# Patient Record
Sex: Male | Born: 1955 | Race: White | Hispanic: No | Marital: Married | Smoking: Former smoker
Health system: Southern US, Community
[De-identification: ages and names within clinical notes are randomized; demographics above are authoritative.]

## PROBLEM LIST (undated history)

## (undated) DIAGNOSIS — I739 Peripheral vascular disease, unspecified: Secondary | ICD-10-CM

## (undated) DIAGNOSIS — E7800 Pure hypercholesterolemia, unspecified: Secondary | ICD-10-CM

## (undated) DIAGNOSIS — C801 Malignant (primary) neoplasm, unspecified: Secondary | ICD-10-CM

## (undated) HISTORY — PX: BACK SURGERY: SHX140

---

## 2001-08-04 ENCOUNTER — Emergency Department (HOSPITAL_COMMUNITY): Admission: EM | Admit: 2001-08-04 | Discharge: 2001-08-04 | Payer: Self-pay | Admitting: *Deleted

## 2001-08-04 ENCOUNTER — Encounter: Payer: Self-pay | Admitting: *Deleted

## 2001-09-19 ENCOUNTER — Encounter: Payer: Self-pay | Admitting: Emergency Medicine

## 2001-09-19 ENCOUNTER — Emergency Department (HOSPITAL_COMMUNITY): Admission: EM | Admit: 2001-09-19 | Discharge: 2001-09-19 | Payer: Self-pay | Admitting: *Deleted

## 2001-11-29 ENCOUNTER — Encounter: Payer: Self-pay | Admitting: Orthopaedic Surgery

## 2001-11-29 ENCOUNTER — Ambulatory Visit (HOSPITAL_COMMUNITY): Admission: RE | Admit: 2001-11-29 | Discharge: 2001-11-29 | Payer: Self-pay | Admitting: Orthopaedic Surgery

## 2002-01-24 ENCOUNTER — Observation Stay (HOSPITAL_COMMUNITY): Admission: AD | Admit: 2002-01-24 | Discharge: 2002-01-24 | Payer: Self-pay | Admitting: Neurosurgery

## 2006-01-14 ENCOUNTER — Ambulatory Visit (HOSPITAL_COMMUNITY): Admission: RE | Admit: 2006-01-14 | Discharge: 2006-01-14 | Payer: Self-pay | Admitting: Neurosurgery

## 2006-08-08 ENCOUNTER — Ambulatory Visit (HOSPITAL_COMMUNITY): Admission: RE | Admit: 2006-08-08 | Discharge: 2006-08-08 | Payer: Self-pay | Admitting: Orthopedic Surgery

## 2007-01-04 ENCOUNTER — Ambulatory Visit (HOSPITAL_COMMUNITY): Admission: RE | Admit: 2007-01-04 | Discharge: 2007-01-04 | Payer: Self-pay | Admitting: Orthopaedic Surgery

## 2007-03-08 ENCOUNTER — Inpatient Hospital Stay (HOSPITAL_COMMUNITY): Admission: RE | Admit: 2007-03-08 | Discharge: 2007-03-11 | Payer: Self-pay | Admitting: Neurosurgery

## 2011-01-21 NOTE — Op Note (Signed)
NAMEMarland Kitchen  EVERET, FLAGG NO.:  [id number]   MEDICAL RECORD NO.:  [id number]          PATIENT TYPE:  INP   LOCATION:  3006                         FACILITY:  [facility]   PHYSICIAN:  Cristi Loron, M.D.DATE OF BIRTH:  Sep 20, 1955   DATE OF PROCEDURE:  03/08/2007  DATE OF DISCHARGE:                               OPERATIVE REPORT   BRIEF HISTORY:  The patient is a 55 year old white male who has  undergone 2 previous back surgeries for ruptured disk by me.  He did  well but more recently has developed recurrent back and bilateral leg  pain.  He failed medical management and was worked up with a lumbar MRI  which demonstrated he had a recurrent ruptured disk L3-4 and 4-5 with  degenerative disease, spinal stenosis, etc.  I discussed the various  treatment with the patient including surgery.  The patient has weighed  the risks, benefits and alternatives of surgery and decided to proceed  with a L3-4 and L4-5 posterior lumbar interbody fusion, placement of  interbody prosthesis and posterior instrumentation.   PREOPERATIVE DIAGNOSIS:  L3-4 and L4-5 degenerative disk disease,  recurrent herniated stenosis, lumbar stenosis, lumbar radiculopathy,  lumbago.   POSTOPERATIVE DIAGNOSIS:  L3-4 and L4-5 degenerative disk disease,  recurrent herniated stenosis, lumbar stenosis, lumbar radiculopathy,  lumbago.   PROCEDURE:  L3-4 and L4-5 transforaminal lumbar interbody fusion;  insertion of L3-4 and L4-5 interbody prosthesis (Capstone PEEK cages);  L3-4 and L4-5 posterior segmental instrumentation with Legacy titanium  pedicle screws and rods; L3-4 and L4-5 posterolateral arthrodesis with  local morselized autograft bone and Vitoss bone graft extender.  Left L3-  4 redo microdiskectomy, right L4-5 redo microdiskectomy.   SURGEON:  Dr. Delma Officer.   ASSISTANT:  Dr. Venetia Maxon.   ANESTHESIA:  General endotracheal.   ESTIMATED BLOOD LOSS:  250 mL.   SPECIMENS:  None.   DRAINS:   None.   COMPLICATIONS:  None.   DESCRIPTION OF PROCEDURE:  The patient was brought to the operating room  by anesthesia team.  General endotracheal anesthesia was induced.  The  patient was then carefully turned to the prone position on a Wilson  frame.  His lumbosacral region was then prepared with Betadine scrub and  Betadine solution.  Sterile drapes were applied.  I then injected the  area to be incised with Marcaine with epinephrine solution.  I used a  scalpel to incise through the patient's prior surgical incision.  I  extended the incision in a cephalad and caudal direction and then used  the electrocautery to perform a subperiosteal dissection bilaterally  exposing the spinous process of lamina of 2, 3, 4, and 5.  We obtained  intraoperative radiograph to confirm our location.   We then inserted the [facility] retractor for exposure.  We began at L3-  4 on the left.  I used a high-speed drill to extend the patient's prior  left L3 laminotomy in a cephalad direction until I encountered some  relatively nonscarred-down dura.  We then widened the laminotomy with a  Kerrison punch and performed a foraminotomy about the left L3  and L4  nerve roots.  Because of the disk herniation, the decompression at this  level was greater than required to do a simple transforaminal lumbar  interbody fusion, i.e. we performed a decompression of the left L3 and  L4 nerve roots.  We freed up the thecal sac and the L3 and L4 nerve  roots from the epidural tissue, and we encountered expected disk  herniation at the disk space.  We removed multiple fragments using the  pituitary forceps.  We then incised the L3-4 intervertebral disk and  performed a partial intervertebral diskectomy with the pituitary  forceps.   In preparation for the transforaminal lumbar fusion, we used Epstein  Scoville curettes to clear soft tissue from the vertebral endplates at  L3-4.  We removed the inferior facette of L3  to gain a wider lateral  exposure on the left.  We then used a trial spacer to determine to use a  10 x 26-mm Capstone PEEK interbody prosthesis.  We prefilled this  prosthesis with Vitoss and local autograft bone we obtained during the  decompression, as well as filling anterior in the disk space with Vitoss  and local bone graft.  We inserted the prosthesis into the L3-4  interspace and then used a bone tamp and turned it sideways.  We then  filled posterior to the prosthesis with Vitoss and local autograft,  completing the transforaminal lumbar interbody fusion.  I should mention  that prior to placing the prosthesis we retracted neural structures out  of harm's way.   We then repeated this procedure in an analogous fashion at L4-5 on the  right, i.e. we used a high-speed drill to perform a left L4 laminotomy.  We widened lamina with a Kerrison punch.  We removed the right L4-5  ligamentum flavum, performed a foraminotomy about the right L4-L5 nerve  roots, completing it.  Again, we did a wider decompression than was  required to do the transforaminal lumbar interbody fusion.  We incised  the L4-5 intervertebral disk on the right and used Epstein and Scoville  curettes to clear soft tissue from the vertebral endplates of L4-5.  We  then used trial spacers and determined to use a 12 x 26-mm interbody  spacer at this level.  We again prefilled it and placed it into the  intervertebral disk space, turned it sideways.  We filled both anterior  and posterior to the prosthesis with local autograft bone and Vitoss,  completing the transforaminal lumbar interbody fusion at L4-5.  I should  mention we removed the inferior facet of L4.   Having completed the decompression as well as the transforaminal lumbar  interbody fusion and insertion of prosthesis, we now turned our  attention to the instrumentation.  Under fluoroscopic guidance, we  cannulated the bilateral L3, 4, and 5 pedicles with  bone probes.  We  tapped pedicles with 5.5-mm tap.  We probed inside the tapped pedicles  to rule out cortical breeches, and then we inserted a 6.5 x 45-mm  pedicle screws bilaterally at L3, 4, and 5 under fluoroscopic guidance.  We palpated along the medial aspect of the bilateral L3, 4, and 5  pedicles and noted the nerve roots were not injured, and there were no  cortical breeches.  We then connected unilateral pedicle screws with a  lordotic rod which we fastened in place with the capsule which we  tightened appropriately, completing the instrumentation.   We now turned our attention to the posterolateral  arthrodesis.  We  cleared soft tissue from the remainder of the right L3-4 and left L4-5  facette pars region.  We then decorticated the structure with a high-  speed drill and placed a combination of Vitoss and local morselized  autograft bone over these decorticated posterolateral structures  completing the posterolateral arthrodesis.   We then inspected the thecal sac and the left L3 and L4 nerve root and  the right L4 and L5 nerve roots and noted that neural structures were  well decompressed.  We obtained hemostasis using bipolar cautery.  We  then irrigated the wound out with bacitracin solution, removed the  retractor and then reapproximated the patient's thoracolumbar fascia  with interrupted #1 Vicryl suture, subcutaneous tissue with interrupted  2-0 Vicryl suture and skin with Steri-Strips and Benzoin.  The wound was  then coated with bacitracin ointment, a sterile dressing was applied,  the drapes  were removed and the patient was subsequently returned to supine  position where he was extubated by anesthesia team and transported to  the post-anesthesia care unit in stable condition.  All sponge,  instrument and needle counts were correct at the end of this case.      Cristi Loron, M.D.  Electronically Signed     JDJ/MEDQ  D:  03/08/2007  T:  03/09/2007   Job:  [id number]

## 2011-01-24 NOTE — Op Note (Signed)
[place]  Patient:    [name], [name] Visit Number: [id number] MRN: [id number]          Service Type: DFT Location: 3000 [id number] Attending Physician:  Cristi Loron Dictated by:   Cristi Loron, M.D. Proc. Date: 01/24/02 Admit Date:  01/24/2002 Discharge Date: 01/24/2002                             Operative Report  PREOPERATIVE DIAGNOSES:  Left L3-4 herniated nucleus pulposus, spinal stenosis, degenerative disk disease, lumbar radiculopathy, and lumbago.  POSTOPERATIVE DIAGNOSES:  Left L3-4 herniated nucleus pulposus, spinal stenosis, degenerative disk disease, lumbar radiculopathy, and lumbago.  PROCEDURE:  Left L3-4 microdiskectomy using microdissection.  SURGEON:  Cristi Loron, M.D.  ASSISTANT:  Clydene Fake, M.D.  ANESTHESIA:  General endotracheal.  ESTIMATED BLOOD LOSS:  100 cc.  SPECIMENS:  None.  DRAINS:  None.  COMPLICATIONS:  None.  BRIEF HISTORY:  The patient is a 55 year old white male who has suffered from back and left leg pain.  He failed medical management and was worked up with a lumbar MRI, which demonstrated a herniated disk at L3-4 on the left.  His signs, symptoms, and physical exam were consistent with a left L4 radiculopathy.  I therefore discussed the various treatment options with him, including surgery.  The patient weighed the risks, benefits, and alternatives of surgery and decided to proceed with a microdiskectomy.  DESCRIPTION OF PROCEDURE:  The patient was brought to the operating room by the anesthesia team.  General endotracheal anesthesia was induced.  The patient was then turned to the prone position on the Wilson frame.  His lumbosacral region was then prepared with Betadine scrub and Betadine solution, and sterile drapes were applied.  I then injected the area to be incised with Marcaine with epinephrine solution and used a scalpel to make a linear midline incision over  the L3-4 interspace.  I used electrocautery to dissect down to the thoracolumbar fascia and divided the fascia just to the left of midline, performing a left-sided subperiosteal dissection and stripping the paraspinous musculature from the left spinous process and lamina of L3 and L4.  I inserted the [facility] retractor for exposure and then obtained an intraoperative radiograph.  I then brought the operating microscope into the field and under its magnification and illumination, I completed the microdissection/decompression. I used a high-speed drill to perform a left L3 laminotomy.  I widened the laminotomy with a Kerrison punch and removed the left L3-4 ligamentum flavum. I performed a foraminotomy about the left L4 nerve root.  I then used microdissection to free up the thecal sac and the left L4 nerve root from the epidural tissue and then gently retracted medially with the DErrico retractor.  This demonstrated a large herniated disk.  Part of the disk herniation was a free fragment and had migrated in a cephalad direction.  I removed it with the pituitary forceps, greatly decompressing the thecal sac. I then inspected the hole in the annulus at 3-4.  It was a large hole, and there was quite a bit of bulging disk, and there was degenerated disk protruding through the hole.  I then decided to go into the interspace.  I used a 15 blade scalpel to incise the left L3-4 ligamentum flavum, and then I performed a partial intervertebral diskectomy using the pituitary forceps and the Angelique Blonder and Scoville curettes.  After I was  satisfied with the diskectomy, I then palpated along the ventral surface of the thecal sac as well as the exit route of the L4 nerve root and noted that the neural structures were well- decompressed.  I then achieved stringent hemostasis using the bipolar electrocautery, and then I copiously irrigated the wound with bacitracin solution.  I removed the solution and then  removed the [facility] retractor. I reapproximated the patients thoracolumbar fascia with interrupted #1 Vicryl suture, the subcutaneous tissue with interrupted 2-0 Vicryl suture, and the skin with Steri-Strips and benzoin.  The wound was then coated with bacitracin ointment and sterile dressings applied.  The drapes were removed.  The patient was subsequently extubated by the anesthesia team and transported to the postanesthesia care unit in stable condition.  All sponge, instrument, and needle counts were correct at the end of this case. Dictated by:   Cristi Loron, M.D. Attending Physician:  Tressie Stalker D DD:  01/24/02 TD:  01/26/02 Job: [id number]

## 2011-01-24 NOTE — Op Note (Signed)
[name], [name] NO.:  [id number]   MEDICAL RECORD NO.:  [id number]          PATIENT TYPE:  AMB   LOCATION:  SDS                          FACILITY:  [facility]   PHYSICIAN:  Cristi Loron, M.D.DATE OF BIRTH:  [birth year]   DATE OF PROCEDURE:  01/14/2006  DATE OF DISCHARGE:                                 OPERATIVE REPORT   BRIEF HISTORY:  The patient is 55 year old white male who has suffered from  back and left leg pain.  I previously performed a left L3-4 microdissection  on him 7 years ago.  He did well but his pain more recently has recurred.  He was worked up with a lumbar MRI which demonstrated recurrent herniated  disk at L3-4 on the left central, consistent left L4 radiculopathy.  I  discussed various treatment options with the patient including surgery.  The  patient has weighed the risks, benefits and alternatives and decided to  proceed with a left L3-4 microdiskectomy.   PREOP DIAGNOSES:  Recurrent left L3-4 herniated nucleus pulposus, spinal  stenosis, lumbar radiculopathy, lumbago.   POSTOPERATIVE DIAGNOSES:  Recurrent left L3-4 herniated nucleus pulposus,  spinal stenosis, lumbar radiculopathy, lumbago.   PROCEDURE:  Redo left L3-4 microdiskectomy using microdissection.   SURGEON:  Dr. Delma Officer.   ASSISTANT:  Danae Orleans. Venetia Maxon, M.D.   ANESTHESIA:  General endotracheal.   ESTIMATED BLOOD LOSS:  50 mL.   SPECIMENS:  None.   DRAINS:  None.   COMPLICATIONS:  None.   DESCRIPTION OF PROCEDURE:  The patient is brought to the operating room by  anesthesia team.  General endotracheal anesthesia was induced.  The patient  was turned to the prone position on the Wilson frame.  His lumbosacral  region was then prepared with Betadine scrub and Betadine solution.  Sterile  drapes were applied.  I then injected the area to be incised with Marcaine  with epinephrine solution.  I used a scalpel making to ellipse out the  patient's prior  surgical incision.  I used electrocautery perform a  subperiosteal dissection exposing left spinous process lamina of L3 and L4  and we obtained intraoperative radiograph to confirm our location.  I then  inserted the [facility] retractor for exposure.  We then brought the  operative microscope to the field and under its magnification and  illumination completed the microdissection/decompression.  I used high-speed  to drill away some the epidural fibrosis and to extend the patient's prior  left L3 laminotomy in a cephalad direction until I encountered some  relatively nonscarred down dura.  I then used microdissection to free up the  dura from the epidural fibrosis and then we identified the exiting left L4  nerve root.  I then performed a foraminotomy about the left L4 nerve root.  I then freed up the nerve root using microdissection and then Dr. Venetia Maxon  gently retracted the nerve root medially with a D'Errico retractor.  This  exposed the underlying disk herniation which were removed and multiple  fragments using pituitary forceps.  We then inspected the hole in the  annulus fibrosis.  There  was some impending herniation.  We therefore  performed a partial intervertebral diskectomy using pituitary forceps and  the Epstein curettes.  After we were satisfied with the intervertebral  diskectomy used osteophyte tool to remove some redundant posterior  longitudinal ligament from the vertebral endplates at L3-4 further  decompressing the thecal sac and nerve root, then palpated along the ventral  surface of the thecal sac and along the exit route of the left L4 nerve root  and noted neural structure well decompressed.  We obtained hemostasis using  bipolar cautery.  The wound out with bacitracin solution and then removed  the [facility] retractor, then reapproximated the patient's thoracolumbar  fascia with interrupted #1 Vicryl suture, subcutaneous tissue with  interrupted 2-0 Vicryl suture  and the skin with Steri-Strips and Benzoin.  The wound was then coated bacitracin ointment, sterile dressing applied.  The drapes were removed.  The patient subsequently returned to supine  position where he was extubated by anesthesia team and transported to post  anesthesia care unit in stable condition.  All sponge, instrument and needle  counts correct end this case.      Cristi Loron, M.D.  Electronically Signed     JDJ/MEDQ  D:  01/14/2006  T:  01/15/2006  Job:  [id number]
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
## 2011-06-24 LAB — CBC
HCT: 42.8
MCHC: 34
MCV: 96.7
RBC: 4.43
WBC: 10.6 — ABNORMAL HIGH

## 2011-06-24 LAB — BASIC METABOLIC PANEL
CO2: 28
Chloride: 99
Creatinine, Ser: 0.82
GFR calc Af Amer: >60
Potassium: 3.9

## 2011-06-25 LAB — CBC
HCT: 50.9
MCHC: 34
MCV: 96
Platelets: 205
RDW: 14.4 — ABNORMAL HIGH
WBC: 9.6

## 2011-06-25 LAB — ABO/RH: ABO/RH(D): A POS

## 2011-06-25 LAB — TYPE AND SCREEN: ABO/RH(D): A POS

## 2016-01-18 DIAGNOSIS — R202 Paresthesia of skin: Secondary | ICD-10-CM | POA: Diagnosis not present

## 2016-01-18 DIAGNOSIS — I739 Peripheral vascular disease, unspecified: Secondary | ICD-10-CM | POA: Diagnosis not present

## 2016-01-18 DIAGNOSIS — E785 Hyperlipidemia, unspecified: Secondary | ICD-10-CM | POA: Diagnosis not present

## 2016-01-25 DIAGNOSIS — Z87891 Personal history of nicotine dependence: Secondary | ICD-10-CM | POA: Diagnosis not present

## 2016-01-25 DIAGNOSIS — I7092 Chronic total occlusion of artery of the extremities: Secondary | ICD-10-CM | POA: Diagnosis not present

## 2016-01-25 DIAGNOSIS — I70212 Atherosclerosis of native arteries of extremities with intermittent claudication, left leg: Secondary | ICD-10-CM | POA: Diagnosis not present

## 2016-01-25 DIAGNOSIS — Z7982 Long term (current) use of aspirin: Secondary | ICD-10-CM | POA: Diagnosis not present

## 2016-01-25 DIAGNOSIS — I700 Atherosclerosis of aorta: Secondary | ICD-10-CM | POA: Diagnosis not present

## 2016-01-25 DIAGNOSIS — Z95820 Peripheral vascular angioplasty status with implants and grafts: Secondary | ICD-10-CM | POA: Diagnosis not present

## 2016-07-10 DIAGNOSIS — Z23 Encounter for immunization: Secondary | ICD-10-CM | POA: Diagnosis not present

## 2016-09-04 DIAGNOSIS — I872 Venous insufficiency (chronic) (peripheral): Secondary | ICD-10-CM | POA: Diagnosis not present

## 2016-09-04 DIAGNOSIS — I739 Peripheral vascular disease, unspecified: Secondary | ICD-10-CM | POA: Diagnosis not present

## 2016-11-18 DIAGNOSIS — I739 Peripheral vascular disease, unspecified: Secondary | ICD-10-CM | POA: Diagnosis not present

## 2016-11-18 DIAGNOSIS — I872 Venous insufficiency (chronic) (peripheral): Secondary | ICD-10-CM | POA: Diagnosis not present

## 2016-11-18 DIAGNOSIS — E785 Hyperlipidemia, unspecified: Secondary | ICD-10-CM | POA: Diagnosis not present

## 2017-02-05 DIAGNOSIS — Z95820 Peripheral vascular angioplasty status with implants and grafts: Secondary | ICD-10-CM | POA: Diagnosis not present

## 2017-02-05 DIAGNOSIS — I70212 Atherosclerosis of native arteries of extremities with intermittent claudication, left leg: Secondary | ICD-10-CM | POA: Diagnosis not present

## 2017-02-05 DIAGNOSIS — Z87891 Personal history of nicotine dependence: Secondary | ICD-10-CM | POA: Diagnosis not present

## 2017-02-05 DIAGNOSIS — I87309 Chronic venous hypertension (idiopathic) without complications of unspecified lower extremity: Secondary | ICD-10-CM | POA: Diagnosis not present

## 2017-02-05 DIAGNOSIS — E785 Hyperlipidemia, unspecified: Secondary | ICD-10-CM | POA: Diagnosis not present

## 2017-02-05 DIAGNOSIS — Z7902 Long term (current) use of antithrombotics/antiplatelets: Secondary | ICD-10-CM | POA: Diagnosis not present

## 2017-02-05 DIAGNOSIS — Z7982 Long term (current) use of aspirin: Secondary | ICD-10-CM | POA: Diagnosis not present

## 2017-02-05 DIAGNOSIS — I83892 Varicose veins of left lower extremities with other complications: Secondary | ICD-10-CM | POA: Diagnosis not present

## 2017-02-05 DIAGNOSIS — I700 Atherosclerosis of aorta: Secondary | ICD-10-CM | POA: Diagnosis not present

## 2017-02-05 DIAGNOSIS — I872 Venous insufficiency (chronic) (peripheral): Secondary | ICD-10-CM | POA: Diagnosis not present

## 2017-02-09 DIAGNOSIS — I872 Venous insufficiency (chronic) (peripheral): Secondary | ICD-10-CM | POA: Diagnosis not present

## 2017-02-09 DIAGNOSIS — Z9889 Other specified postprocedural states: Secondary | ICD-10-CM | POA: Diagnosis not present

## 2017-07-09 DIAGNOSIS — Z23 Encounter for immunization: Secondary | ICD-10-CM | POA: Diagnosis not present

## 2017-10-20 DIAGNOSIS — I739 Peripheral vascular disease, unspecified: Secondary | ICD-10-CM | POA: Diagnosis not present

## 2017-10-20 DIAGNOSIS — I872 Venous insufficiency (chronic) (peripheral): Secondary | ICD-10-CM | POA: Diagnosis not present

## 2017-10-20 DIAGNOSIS — E785 Hyperlipidemia, unspecified: Secondary | ICD-10-CM | POA: Diagnosis not present

## 2017-11-30 DIAGNOSIS — I739 Peripheral vascular disease, unspecified: Secondary | ICD-10-CM | POA: Diagnosis not present

## 2017-11-30 DIAGNOSIS — R0789 Other chest pain: Secondary | ICD-10-CM | POA: Diagnosis not present

## 2017-12-04 ENCOUNTER — Emergency Department (HOSPITAL_COMMUNITY)
Admission: EM | Admit: 2017-12-04 | Discharge: 2017-12-04 | Disposition: A | Discharge status: Home / Self Care | Payer: BLUE CROSS/BLUE SHIELD | Attending: Emergency Medicine | Admitting: Emergency Medicine

## 2017-12-04 ENCOUNTER — Other Ambulatory Visit: Payer: Self-pay

## 2017-12-04 ENCOUNTER — Emergency Department (HOSPITAL_COMMUNITY): Payer: BLUE CROSS/BLUE SHIELD

## 2017-12-04 ENCOUNTER — Encounter (HOSPITAL_COMMUNITY): Payer: Self-pay | Admitting: Emergency Medicine

## 2017-12-04 DIAGNOSIS — R160 Hepatomegaly, not elsewhere classified: Secondary | ICD-10-CM | POA: Diagnosis not present

## 2017-12-04 DIAGNOSIS — R0789 Other chest pain: Secondary | ICD-10-CM | POA: Diagnosis present

## 2017-12-04 DIAGNOSIS — K7689 Other specified diseases of liver: Secondary | ICD-10-CM | POA: Diagnosis not present

## 2017-12-04 DIAGNOSIS — R079 Chest pain, unspecified: Secondary | ICD-10-CM | POA: Diagnosis not present

## 2017-12-04 DIAGNOSIS — R072 Precordial pain: Secondary | ICD-10-CM | POA: Diagnosis not present

## 2017-12-04 HISTORY — DX: Pure hypercholesterolemia, unspecified: E78.00

## 2017-12-04 HISTORY — DX: Peripheral vascular disease, unspecified: I73.9

## 2017-12-04 LAB — CBC WITH DIFFERENTIAL/PLATELET
BASOS ABS: 0 10*3/uL (ref 0.0–0.1)
Basophils Relative: 0 %
Eosinophils Absolute: 0.1 10*3/uL (ref 0.0–0.7)
Eosinophils Relative: 4 %
HEMATOCRIT: 40.5 % (ref 39.0–52.0)
HEMOGLOBIN: 14.1 g/dL (ref 13.0–17.0)
LYMPHS PCT: 33 %
Lymphs Abs: 1.3 10*3/uL (ref 0.7–4.0)
MCH: 33 pg (ref 26.0–34.0)
MCHC: 34.8 g/dL (ref 30.0–36.0)
MCV: 94.8 fL (ref 78.0–100.0)
MONO ABS: 0.8 10*3/uL (ref 0.1–1.0)
Monocytes Relative: 19 %
NEUTROS ABS: 1.8 10*3/uL (ref 1.7–7.7)
Neutrophils Relative %: 44 %
Platelets: 82 10*3/uL — ABNORMAL LOW (ref 150–400)
RBC: 4.27 MIL/uL (ref 4.22–5.81)
RDW: 13.5 % (ref 11.5–15.5)
WBC: 4 10*3/uL (ref 4.0–10.5)

## 2017-12-04 LAB — BASIC METABOLIC PANEL
ANION GAP: 8 (ref 5–15)
BUN: 16 mg/dL (ref 6–20)
CO2: 24 mmol/L (ref 22–32)
Calcium: 8.7 mg/dL — ABNORMAL LOW (ref 8.9–10.3)
Chloride: 103 mmol/L (ref 101–111)
Creatinine, Ser: 1.08 mg/dL (ref 0.61–1.24)
GFR calc Af Amer: >60 mL/min (ref >60)
GLUCOSE: 117 mg/dL — AB (ref 65–99)
POTASSIUM: 4.6 mmol/L (ref 3.5–5.1)
Sodium: 135 mmol/L (ref 135–145)

## 2017-12-04 LAB — HEPATIC FUNCTION PANEL
ALK PHOS: 79 U/L (ref 38–126)
ALT: 51 U/L (ref 17–63)
AST: 84 U/L — AB (ref 15–41)
Albumin: 2.8 g/dL — ABNORMAL LOW (ref 3.5–5.0)
BILIRUBIN DIRECT: 0.3 mg/dL (ref 0.1–0.5)
Indirect Bilirubin: 0.7 mg/dL (ref 0.3–0.9)
Total Bilirubin: 1 mg/dL (ref 0.3–1.2)
Total Protein: 7.3 g/dL (ref 6.5–8.1)

## 2017-12-04 MED ORDER — OXYCODONE HCL 5 MG PO TABS: 5.0000 mg | ORAL_TABLET | Freq: Four times a day (QID) | ORAL | 0 refills | Status: DC | PRN

## 2017-12-04 MED ORDER — OXYCODONE-ACETAMINOPHEN 5-325 MG PO TABS
1.0000 | ORAL_TABLET | Freq: Once | ORAL | Status: AC
  Administered 2017-12-04: 1 via ORAL
  Filled 2017-12-04: qty 1

## 2017-12-04 MED ORDER — HYDROCODONE-ACETAMINOPHEN 5-325 MG PO TABS
2.0000 | ORAL_TABLET | Freq: Once | ORAL | Status: AC
  Administered 2017-12-04: 2 via ORAL
  Filled 2017-12-04: qty 2

## 2017-12-04 MED ORDER — DIAZEPAM 5 MG PO TABS
10.0000 mg | ORAL_TABLET | Freq: Once | ORAL | Status: AC
  Administered 2017-12-04: 10 mg via ORAL
  Filled 2017-12-04: qty 2

## 2017-12-04 MED ORDER — ONDANSETRON HCL 4 MG PO TABS
4.0000 mg | ORAL_TABLET | Freq: Once | ORAL | Status: AC
  Administered 2017-12-04: 4 mg via ORAL
  Filled 2017-12-04: qty 1

## 2017-12-04 MED ORDER — IOPAMIDOL (ISOVUE-300) INJECTION 61%
75.0000 mL | Freq: Once | INTRAVENOUS | Status: AC | PRN
  Administered 2017-12-04: 75 mL via INTRAVENOUS

## 2017-12-04 NOTE — ED Notes (Signed)
EDP at bedside updating patient and family. 

## 2017-12-04 NOTE — ED Provider Notes (Signed)
[facility] EMERGENCY DEPARTMENT Provider Note   CSN: [id number] Arrival date & time: 12/04/17  0746     History   Chief Complaint Chief Complaint  Patient presents with  . Chest Pain    HPI Charles HINNERS is a 62 y.o. male.  Patient is a 62 year old male who presents to the emergency department with chest pain.  Patient states that he was exercising using exercise bands, when he had a sudden severe pain in his mid to lower chest.  Following this he had pain with movement as well as pain with deep breath.  He did not have any loss of consciousness.  He states he has was sweating a little, but thinks it was related to the level of his pain.  Patient is not had any previous pain or discomfort.  Patient noted to have a high cholesterol.  He denies diabetes, he denies smoking cigarettes, and he denies any first-degree relatives who died suddenly of coronary artery issues.  It is of note that he has some peripheral artery changes, but is never been told he had coronary artery issues.  He presents now for assistance with this issue.     Past Medical History:  Diagnosis Date  . High cholesterol   . PAD (peripheral artery disease) (HCC)     There are no active problems to display for this patient.   Past Surgical History:  Procedure Laterality Date  . BACK SURGERY          Home Medications    Prior to Admission medications   Not on File    Family History History reviewed. No pertinent family history.  Social History Social History   Tobacco Use  . Smoking status: Never Smoker  . Smokeless tobacco: Never Used  Substance Use Topics  . Alcohol use: Yes    Comment: three times weekly  . Drug use: Never     Allergies   Patient has no known allergies.   Review of Systems Review of Systems  Constitutional: Negative for activity change.       All ROS Neg except as noted in HPI  HENT: Negative for nosebleeds.   Eyes: Negative for photophobia and discharge.    Respiratory: Negative for cough, shortness of breath and wheezing.   Cardiovascular: Positive for chest pain. Negative for palpitations.  Gastrointestinal: Negative for abdominal pain and blood in stool.  Genitourinary: Negative for dysuria, frequency and hematuria.  Musculoskeletal: Negative for arthralgias, back pain and neck pain.  Skin: Negative.   Neurological: Negative for dizziness, seizures and speech difficulty.  Psychiatric/Behavioral: Negative for confusion and hallucinations.     Physical Exam Updated Vital Signs BP 118/81   Pulse 67   Temp 98.1 F (36.7 C) (Oral)   Resp 14   Ht 6\' 1"  (1.854 m)   Wt 83.9 kg (185 lb)   SpO2 95%   BMI 24.41 kg/m   Physical Exam  Constitutional: He is oriented to person, place, and time. He appears well-developed and well-nourished.  Non-toxic appearance.  HENT:  Head: Normocephalic.  Right Ear: Tympanic membrane and external ear normal.  Left Ear: Tympanic membrane and external ear normal.  Eyes: Pupils are equal, round, and reactive to light. EOM and lids are normal.  Neck: Normal range of motion. Neck supple. Carotid bruit is not present.  Cardiovascular: Normal rate, regular rhythm, normal heart sounds, intact distal pulses and normal pulses.    Patient has a regular rate and rhythm.  There are no  gallops, murmurs, rubs or rubs.  Peripheral pulses are 2+ bilaterally.  Pulmonary/Chest: Breath sounds normal. No respiratory distress.  There is symmetrical rise and fall of the chest.  The patient speaks in complete sentences without problem.  There is severe pain to palpation even light palpation of the mid to lower sternum.  Abdominal: Soft. Bowel sounds are normal. There is no tenderness. There is no guarding.  Musculoskeletal: Normal range of motion.  Lymphadenopathy:       Head (right side): No submandibular adenopathy present.       Head (left side): No submandibular adenopathy present.    He has no cervical adenopathy.   Neurological: He is alert and oriented to person, place, and time. He has normal strength. No cranial nerve deficit or sensory deficit.  Skin: Skin is warm and dry.  Psychiatric: He has a normal mood and affect. His speech is normal.  Nursing note and vitals reviewed.    ED Treatments / Results  Labs (all labs ordered are listed, but only abnormal results are displayed) Labs Reviewed  BASIC METABOLIC PANEL - Abnormal; Notable for the following components:      Result Value   Glucose, Bld 117 (*)    Calcium 8.7 (*)    All other components within normal limits  CBC WITH DIFFERENTIAL/PLATELET  COMPREHENSIVE METABOLIC PANEL    EKG EKG Interpretation  Date/Time:  Friday December 04 2017 07:59:57 EDT Ventricular Rate:  78 PR Interval:    QRS Duration: 99 QT Interval:  404 QTC Calculation: 461 R Axis:   67 Text Interpretation:  Sinus rhythm Borderline low voltage, extremity leads When compared with ECG of 01/12/2006 No significant change was found Confirmed by Francine Graven [phone]) on 12/04/2017 8:15:06 AM   Radiology Dg Chest 2 View  Result Date: 12/04/2017 CLINICAL DATA:  Chest pain following heavy exercise EXAM: CHEST - 2 VIEW COMPARISON:  None. FINDINGS: There is interstitial thickening and scattered areas of scarring bilaterally. There is no edema or consolidation. The heart size and pulmonary vascularity are normal. No adenopathy. No pneumothorax. No bone lesions. IMPRESSION: Scattered areas of scarring and interstitial thickening bilaterally. No edema or consolidation. Heart size normal. No pneumothorax. Electronically Signed   By: Lowella Grip III M.D.   On: 12/04/2017 08:34   Ct Chest W Contrast  Result Date: 12/04/2017 CLINICAL DATA:  Chest pain for 3 weeks after physical exercise. Possible muscle spasm. EXAM: CT CHEST WITH CONTRAST TECHNIQUE: Multidetector CT imaging of the chest was performed during intravenous contrast administration. CONTRAST:  30mL ISOVUE-300  IOPAMIDOL (ISOVUE-300) INJECTION 61% COMPARISON:  Chest x-ray 12/04/2017 FINDINGS: Cardiovascular: Heart is normal in size. Minimal calcified plaque over the aortic arch. Visualized pulmonary arterial system is within normal. Mediastinum/Nodes: No mediastinal or hilar adenopathy. Lungs/Pleura: Lungs are adequately inflated with minimal atelectasis/scarring along the minor fissure. No lobar consolidation or effusion. Minimal calcified pleural plaque over the anterior mid thorax bilaterally. Airways are within normal. Upper Abdomen: Examination demonstrates evidence of cirrhosis and mild splenomegaly. There is a large heterogeneous mass occupying the medial segment of the left lobe of the liver with surrounding increased density of the adjacent parenchyma. This mass measures approximately 7.8 x 8.5 cm in AP and transverse dimension and appears to have central scar and fairly well-defined capsule. There is a suggestion of several small lymph nodes along the diaphragmatic border just above this mass as well as mild adenopathy over the porta hepatis and gastrohepatic ligament. This likely represents a hepatocellular carcinoma in the  setting of cirrhosis. There is a 3.4 cm left adrenal mass as well as 2 small right adrenal masses with the larger measuring 1.5 cm likely metastatic disease. Calcified plaque over the abdominal aorta. Musculoskeletal: Mild degenerate change of the spine. Lytic lesion over the inferior body of the sternum compatible with metastatic disease. IMPRESSION: No acute cardiopulmonary disease. 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 3 mm nodule left lower lobe likely benign, although recommend attention on follow-up. Large heterogeneous left liver mass measuring 7.8 x 8.5 cm in the setting of cirrhosis. This likely represents hepatocellular carcinoma. There is adjacent adenopathy as described adjacent the diaphragm and in the upper abdomen. Bilateral adrenal masses  likely metastatic disease. Lytic lesion over the sternum likely metastatic disease. Aortic Atherosclerosis (ICD10-I70.0). Electronically Signed   By: Marin Olp M.D.   On: 12/04/2017 10:40    Procedures Procedures (including critical care time)  Medications Ordered in ED Medications  diazepam (VALIUM) tablet 10 mg (10 mg Oral Given 12/04/17 0913)  HYDROcodone-acetaminophen (NORCO/VICODIN) 5-325 MG per tablet 2 tablet (2 tablets Oral Given 12/04/17 0913)  ondansetron (ZOFRAN) tablet 4 mg (4 mg Oral Given 12/04/17 0914)  iopamidol (ISOVUE-300) 61 % injection 75 mL (75 mLs Intravenous Contrast Given 12/04/17 1011)     Initial Impression / Assessment and Plan / ED Course  I have reviewed the triage vital signs and the nursing notes.  Pertinent labs & imaging results that were available during my care of the patient were reviewed by me and considered in my medical decision making (see chart for details).       Final Clinical Impressions(s) / ED Diagnoses  MDM  Vital signs within normal limits.  Pulse oximetry is 98% on room air.  Electrocardiogram is negative for acute STEMI or life-threatening arrhythmias.  Basic metabolic panel shows the glucose to be just slightly elevated at 117, the remainder of the basic metabolic panel is well within normal limits.  Chest x-ray Reveals scattered areas of scarring and interstitial thickening.  No other changes or problems.  In particular there is no changes noted of the sternum. Pain improved with oral Valium and Norco.  Patient's pain is not in proportion to the findings at this time.  Will obtain a CT scan of the chest with contrast. Chest is CT scan of the chest shows no acute cardiopulmonary disease.  There is noted however a large heterogeneous left liver mass measuring 7.8 x 8.5cm.  This raises questions of hepatocellular carcinoma.  There is also noted adenopathy adjacent to the diaphragm, bilateral adrenal masses, and a lytic lesion  involving the sternum.  I have advised the patient and family of these findings and the liver mass.  I have advised them of the need for additional evaluation by hematology oncology.  I spoke with Dr. Walden Field.  She requests that the patient have CT scan of the abdomen and pelvis while here.  This was obtained.  Patient referred to Dr. Walden Field with the [facility].  Prescription for Roxicodone 1 every 6 hours as needed for pain given to the patient.  I reviewed again the importance of them seeing Dr. Walden Field or member of her team upstairs in the cancer center.  They acknowledged understanding of the instructions.   Final diagnoses:  Liver mass  Sternum pain    ED Discharge Orders        Ordered    oxyCODONE (ROXICODONE) 5 MG immediate release tablet  Every 6 hours PRN  12/04/17 [place], [place], PA-C 12/04/17 1308    Francine Graven, DO 12/10/17 1439

## 2017-12-04 NOTE — ED Triage Notes (Signed)
Pt reports having chest pain for 3 weeks after using exercise bands.  Went to [facility] in [place] on Saturday and given Baclofen with no relief.  Hurts to move, breathe, or cough.

## 2017-12-04 NOTE — ED Notes (Signed)
Pt states that he has increases his exercise routine (weights) and have been removing and working on [facility].

## 2017-12-04 NOTE — Discharge Instructions (Signed)
Your vital signs are within normal limits.  Your CT scan suggest a mass involving your liver.  There is also some question of a mass involving your adrenal glands.  There is a lesion on your sternum which is causing the pain in your sternum with movement.  Please continue your current diclofenac medication.  Please add Roxicodone every 6 hours as needed for more severe pain.  This medication may cause drowsiness, it may also cause constipation.  You may need to be on a stool softener while taking this medication.  Please do not drive a vehicle, drink alcohol, operate machinery, or participate in activities requiring concentration when taking this medication.

## 2017-12-09 ENCOUNTER — Other Ambulatory Visit: Payer: Self-pay

## 2017-12-09 ENCOUNTER — Inpatient Hospital Stay (HOSPITAL_COMMUNITY): Payer: BLUE CROSS/BLUE SHIELD | Attending: Hematology | Admitting: Hematology

## 2017-12-09 ENCOUNTER — Inpatient Hospital Stay (HOSPITAL_COMMUNITY): Payer: BLUE CROSS/BLUE SHIELD

## 2017-12-09 ENCOUNTER — Encounter (HOSPITAL_COMMUNITY): Payer: Self-pay | Admitting: Hematology

## 2017-12-09 DIAGNOSIS — B1920 Unspecified viral hepatitis C without hepatic coma: Secondary | ICD-10-CM | POA: Diagnosis not present

## 2017-12-09 DIAGNOSIS — R160 Hepatomegaly, not elsewhere classified: Secondary | ICD-10-CM

## 2017-12-09 DIAGNOSIS — Z7982 Long term (current) use of aspirin: Secondary | ICD-10-CM | POA: Insufficient documentation

## 2017-12-09 DIAGNOSIS — R162 Hepatomegaly with splenomegaly, not elsewhere classified: Secondary | ICD-10-CM | POA: Diagnosis not present

## 2017-12-09 DIAGNOSIS — I739 Peripheral vascular disease, unspecified: Secondary | ICD-10-CM | POA: Diagnosis not present

## 2017-12-09 DIAGNOSIS — R079 Chest pain, unspecified: Secondary | ICD-10-CM | POA: Diagnosis not present

## 2017-12-09 DIAGNOSIS — Z79899 Other long term (current) drug therapy: Secondary | ICD-10-CM | POA: Diagnosis not present

## 2017-12-09 DIAGNOSIS — Z87891 Personal history of nicotine dependence: Secondary | ICD-10-CM | POA: Insufficient documentation

## 2017-12-09 LAB — PROTIME-INR
INR: 1.11
Prothrombin Time: 14.2 seconds (ref 11.4–15.2)

## 2017-12-09 LAB — APTT: aPTT: 30 seconds (ref 24–36)

## 2017-12-09 MED ORDER — OXYCODONE HCL 5 MG PO TABS: 10.0000 mg | ORAL_TABLET | Freq: Four times a day (QID) | ORAL | 0 refills | Status: DC | PRN

## 2017-12-09 NOTE — Assessment & Plan Note (Signed)
1.  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  He has a 8 cm dominant hepatic mass with metastasis to the sternum and bilateral adrenal glands.  I have recommended doing a PET/CT scan to evaluate full extent of metastatic disease.  As his disease is unresectable, I have recommended biopsy of the liver lesion.  We will arrange it by radiology in [place].  I will obtain a alpha-fetoprotein level today.  Patient reports that he was diagnosed with hepatitis C in 2006, likely from tattoos.  We will send hepatitis panel to check for hepatitis B also.  I will see him back after the biopsy and PET/CT scan to discuss results and further plan of treatment.  2.  Chest pain: He has pain in the sternal region which is radiating predominantly to the left side inframammary area.  He is using oxycodone 5 mg 3-4 times a day which is not controlling it optimally.  He is having to take ibuprofen with it.  I have given a prescription for oxycodone 10 mg to be taken every 6 hours as needed.  He was told to call us if this does not work.

## 2017-12-09 NOTE — Progress Notes (Signed)
[facility] NOTE  Patient Care Team: Patient, No Pcp Per as PCP - General (General Practice)  CHIEF COMPLAINTS/PURPOSE OF CONSULTATION:  Hepatic mass.  HISTORY OF PRESENTING ILLNESS:  [name] 62 y.o. male is seen in consultation today for further management of newly diagnosed hepatic mass.  He presented to the emergency room on 12/04/2017 with sternal chest pain.  The pain started when he was doing exercises with bands.  He reports the pain in the center of the sternum, sharp in nature when it gets worse, and dull but the rest of the time.  The pain last few hours.  He was given oxycodone 5 mg to be taken every 6 hours.  The pain is radiating to the left inframammary area and less to the right inframammary area.  A CT scan was done in the emergency room of the chest which showed a sternal lesion with 8 cm mass in the liver.  Patient has a known history of hepatitis C which was diagnosed in 2006.  He thinks he got it from tattoos.  He also takes Plavix for the last 2 years for peripheral artery disease.  He denies any nausea or vomiting.  He denies any blood transfusions.  He bruises easily since started Plavix.  He denies any weight loss.  He has good appetite.  The sternal pain started 3 weeks ago.  He denies any bleeding per rectum or melena.  MEDICAL HISTORY:  Past Medical History:  Diagnosis Date  . High cholesterol   . PAD (peripheral artery disease) ([place])     SURGICAL HISTORY: Past Surgical History:  Procedure Laterality Date  . BACK SURGERY      SOCIAL HISTORY: Social History   Socioeconomic History  . Marital status: Married    Spouse name: Not on file  . Number of children: Not on file  . Years of education: Not on file  . Highest education level: Not on file  Occupational History  . Not on file  Social Needs  . Financial resource strain: Not on file  . Food insecurity:    Worry: Not on file    Inability: Not on file  . Transportation  needs:    Medical: Not on file    Non-medical: Not on file  Tobacco Use  . Smoking status: Former Smoker    Packs/day: 2.00    Years: 40.00    Pack years: 80.00    Types: Cigarettes    Last attempt to quit: 12/09/2012    Years since quitting: 5.0  . Smokeless tobacco: Never Used  Substance and Sexual Activity  . Alcohol use: Yes    Alcohol/week: 9.0 oz    Types: 15 Cans of beer per week  . Drug use: Never  . Sexual activity: Not on file  Lifestyle  . Physical activity:    Days per week: Not on file    Minutes per session: Not on file  . Stress: Not on file  Relationships  . Social connections:    Talks on phone: Not on file    Gets together: Not on file    Attends religious service: Not on file    Active member of club or organization: Not on file    Attends meetings of clubs or organizations: Not on file    Relationship status: Not on file  . Intimate partner violence:    Fear of current or ex partner: Not on file    Emotionally abused: Not on  file    Physically abused: Not on file    Forced sexual activity: Not on file  Other Topics Concern  . Not on file  Social History Narrative  . Not on file    FAMILY HISTORY: Family History  Problem Relation Age of Onset  . Alzheimer's disease Mother   . Cancer Sister        breast    ALLERGIES:  has No Known Allergies.  MEDICATIONS:  Current Outpatient Medications  Medication Sig Dispense Refill  . aspirin EC 81 MG tablet Take 81 mg by mouth daily.    Marland Kitchen atorvastatin (LIPITOR) 40 MG tablet TK 1 T PO QD  5  . capsaicin (ZOSTRIX) 0.025 % cream APP EXT AA TID  0  . clopidogrel (PLAVIX) 75 MG tablet TK 1 T PO  QD  3  . diclofenac (VOLTAREN) 50 MG EC tablet TK 1 T PO BID  0  . oxyCODONE (OXY IR/ROXICODONE) 5 MG immediate release tablet Take 2 tablets (10 mg total) by mouth every 6 (six) hours as needed for severe pain. 60 tablet 0   No current facility-administered medications for this visit.     REVIEW OF SYSTEMS:    Constitutional: Denies fevers, chills or abnormal night sweats Eyes: Denies blurriness of vision, double vision or watery eyes Ears, nose, mouth, throat, and face: Denies mucositis or sore throat Respiratory: Denies cough, dyspnea or wheezes Cardiovascular: Denies palpitation, lower extremity swelling.  Has midsternal pain for the last 3 weeks. Gastrointestinal:  Denies nausea, heartburn or change in bowel habits Skin: Denies abnormal skin rashes Lymphatics: Denies new lymphadenopathy or easy bruising Neurological:Denies numbness, tingling or new weaknesses Behavioral/Psych: Mood is stable, no new changes  All other systems were reviewed with the patient and are negative.  PHYSICAL EXAMINATION: ECOG PERFORMANCE STATUS: 1 - Symptomatic but completely ambulatory  Vitals:   12/09/17 0931  BP: 130/78  Pulse: 77  Resp: 16  Temp: (!) 97.5 F (36.4 C)  SpO2: 99%   Filed Weights   12/09/17 0931  Weight: 189 lb (85.7 kg)    GENERAL:alert, no distress and comfortable SKIN: skin color, texture, turgor are normal, no rashes or significant lesions EYES: normal, conjunctiva are pink and non-injected, sclera clear OROPHARYNX:no exudate, no erythema and lips, buccal mucosa, and tongue normal  NECK: supple, thyroid normal size, non-tender, without nodularity LYMPH:  no palpable lymphadenopathy in the cervical, axillary or inguinal LUNGS: clear to auscultation and percussion with normal breathing effort HEART: regular rate & rhythm and no murmurs and no lower extremity edema ABDOMEN: No clear palpable masses.  Abdominal examination is limited from pain from deep inspiration. Musculoskeletal:no cyanosis of digits and no clubbing  PSYCH: alert & oriented x 3 with fluent speech NEURO: no focal motor/sensory deficits  LABORATORY DATA:  I have reviewed the data as listed Lab Results  Component Value Date   WBC 4.0 12/04/2017   HGB 14.1 12/04/2017   HCT 40.5 12/04/2017   MCV 94.8  12/04/2017   PLT 82 (L) 12/04/2017     Chemistry      Component Value Date/Time   NA 135 12/04/2017 0921   K 4.6 12/04/2017 0921   CL 103 12/04/2017 0921   CO2 24 12/04/2017 0921   BUN 16 12/04/2017 0921   CREATININE 1.08 12/04/2017 0921      Component Value Date/Time   CALCIUM 8.7 (L) 12/04/2017 0921   ALKPHOS 79 12/04/2017 0921   AST 84 (H) 12/04/2017 0921   ALT  51 12/04/2017 0921   BILITOT 1.0 12/04/2017 0921       RADIOGRAPHIC STUDIES: I have personally reviewed the radiological images as listed and agreed with the findings in the report. Dg Chest 2 View  Result Date: 12/04/2017 CLINICAL DATA:  Chest pain following heavy exercise EXAM: CHEST - 2 VIEW COMPARISON:  None. FINDINGS: There is interstitial thickening and scattered areas of scarring bilaterally. There is no edema or consolidation. The heart size and pulmonary vascularity are normal. No adenopathy. No pneumothorax. No bone lesions. IMPRESSION: Scattered areas of scarring and interstitial thickening bilaterally. No edema or consolidation. Heart size normal. No pneumothorax. Electronically Signed   By: Lowella Grip III M.D.   On: 12/04/2017 08:34   Ct Chest W Contrast  Result Date: 12/04/2017 CLINICAL DATA:  Chest pain for 3 weeks after physical exercise. Possible muscle spasm. EXAM: CT CHEST WITH CONTRAST TECHNIQUE: Multidetector CT imaging of the chest was performed during intravenous contrast administration. CONTRAST:  65mL ISOVUE-300 IOPAMIDOL (ISOVUE-300) INJECTION 61% COMPARISON:  Chest x-ray 12/04/2017 FINDINGS: Cardiovascular: Heart is normal in size. Minimal calcified plaque over the aortic arch. Visualized pulmonary arterial system is within normal. Mediastinum/Nodes: No mediastinal or hilar adenopathy. Lungs/Pleura: Lungs are adequately inflated with minimal atelectasis/scarring along the minor fissure. No lobar consolidation or effusion. Minimal calcified pleural plaque over the anterior mid thorax  bilaterally. Airways are within normal. Upper Abdomen: Examination demonstrates evidence of cirrhosis and mild splenomegaly. There is a large heterogeneous mass occupying the medial segment of the left lobe of the liver with surrounding increased density of the adjacent parenchyma. This mass measures approximately 7.8 x 8.5 cm in AP and transverse dimension and appears to have central scar and fairly well-defined capsule. There is a suggestion of several small lymph nodes along the diaphragmatic border just above this mass as well as mild adenopathy over the porta hepatis and gastrohepatic ligament. This likely represents a hepatocellular carcinoma in the setting of cirrhosis. There is a 3.4 cm left adrenal mass as well as 2 small right adrenal masses with the larger measuring 1.5 cm likely metastatic disease. Calcified plaque over the abdominal aorta. Musculoskeletal: Mild degenerate change of the spine. Lytic lesion over the inferior body of the sternum compatible with metastatic disease. IMPRESSION: No acute cardiopulmonary disease. 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 3 mm nodule left lower lobe likely benign, although recommend attention on follow-up. Large heterogeneous left liver mass measuring 7.8 x 8.5 cm in the setting of cirrhosis. This likely represents hepatocellular carcinoma. There is adjacent adenopathy as described adjacent the diaphragm and in the upper abdomen. Bilateral adrenal masses likely metastatic disease. Lytic lesion over the sternum likely metastatic disease. Aortic Atherosclerosis (ICD10-I70.0). Electronically Signed   By: Marin Olp M.D.   On: 12/04/2017 10:40   Ct Abdomen Pelvis W Contrast  Result Date: 12/04/2017 CLINICAL DATA:  Chest pain for 3 weeks. Pain with motion and breathing. Hepatic and adrenal masses on chest CT. EXAM: CT ABDOMEN AND PELVIS WITH CONTRAST TECHNIQUE: Multidetector CT imaging of the abdomen and pelvis was performed using  the standard protocol following bolus administration of intravenous contrast. CONTRAST:  35mL ISOVUE-300 IOPAMIDOL (ISOVUE-300) INJECTION 61% COMPARISON:  Chest CT earlier the same date. FINDINGS: Lower chest: Mild emphysema at both lung bases. No significant pleural or pericardial effusion. Hepatobiliary: Morphologic changes of hepatic cirrhosis are again noted. Exophytic lesion involving the medial segment of the left hepatic lobe is again noted, measuring up to 13.8 x 11.5 cm on image  17/2. This is suboptimally evaluated due to the contrast administered for the chest CT 2 hours ago. It demonstrated heterogeneous enhancement on the chest CT and probable central irregular scarring. There is some washout on the delayed phase images. These features remain highly concerning for hepatocellular carcinoma. There is possible extracapsular extension of tumor superiorly around the IVC, as correlated with the chest CT. Mild gallbladder wall thickening. No evidence of calcified gallstones or biliary dilatation. Pancreas: Unremarkable. No pancreatic ductal dilatation or surrounding inflammatory changes. Spleen: Normal in size without focal abnormality. Adrenals/Urinary Tract: 3.0 x 2.5 cm left adrenal nodule measures 83 HU. This demonstrates nonspecific washout, measuring 64 HU on the delayed phase images through the kidneys. There is minimal nodularity of the right adrenal gland. No suspicious renal findings. There is probable mild renal cortical scarring bilaterally. The bladder appears unremarkable. Stomach/Bowel: No evidence of bowel wall thickening, distention or surrounding inflammatory change. The appendix appears normal. Vascular/Lymphatic: There are small lymph nodes in the porta hepatis, not pathologically enlarged. Aortic and branch vessel atherosclerosis with a large calcified component in the mid aorta (image 41/2). The portal, superior mesenteric and splenic veins are patent. The hepatic veins are not well  visualized. Reproductive: Mild enlargement of the prostate gland. The seminal vesicles appear normal. Other: Trace perihepatic ascites with mild mesenteric edema. The anterior abdominal wall is intact. Musculoskeletal: No acute or significant osseous findings. Mild lumbar spine degenerative changes status post fusion from L3 through L5. IMPRESSION: 1. The previously demonstrated large lesion involving the left hepatic lobe remains highly concerning for hepatocellular carcinoma, although is suboptimally characterized on this examination due to preceding contrast for enhanced chest CT. Further evaluation with abdominal MRI without and with contrast recommended. 2. Indeterminate left adrenal mass, potentially a metastasis. 3. Severe aortic atherosclerosis. Electronically Signed   By: Richardean Sale M.D.   On: 12/04/2017 12:31    ASSESSMENT & PLAN:  Liver mass 1.  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  He has a 8 cm dominant hepatic mass with metastasis to the sternum and bilateral adrenal glands.  I have recommended doing a PET/CT scan to evaluate full extent of metastatic disease.  As his disease is unresectable, I have recommended biopsy of the liver lesion.  We will arrange it by radiology in [place].  I will obtain a alpha-fetoprotein level today.  Patient reports that he was diagnosed with hepatitis C in 2006, likely from tattoos.  We will send hepatitis panel to check for hepatitis B also.  I will see him back after the biopsy and PET/CT scan to discuss results and further plan of treatment.  2.  Chest pain: He has pain in the sternal region which is radiating predominantly to the left side inframammary area.  He is using oxycodone 5 mg 3-4 times a day which is not controlling it optimally.  He is having to take ibuprofen with it.  I have given a prescription for oxycodone 10 mg to be taken every  6 hours as needed.  He was told to call us if this does not work.  Orders Placed This Encounter  Procedures  . NM PET Image Initial (PI) Skull Base To Thigh    Order Specific Question:   If indicated for the ordered procedure, I authorize the administration of a radiopharmaceutical per Radiology protocol    Answer:   Yes    Order  Specific Question:   Preferred imaging location?    Answer:   [facility]    Order Specific Question:   Radiology Contrast Protocol - do NOT remove file path    Answer:   \\[email or web address]    Order Specific Question:   Reason for Exam additional comments    Answer:   liver mass with adrenal mass  . CT Biopsy    Order Specific Question:   Lab orders requested (DO NOT place separate lab orders, these will be automatically ordered during procedure specimen collection):    Answer:   None    Order Specific Question:   Reason for Exam (SYMPTOM  OR DIAGNOSIS REQUIRED)    Answer:   liver mass    Order Specific Question:   Preferred imaging location?    Answer:   [facility]    Order Specific Question:   Radiology Contrast Protocol - do NOT remove file path    Answer:   \\[email or web address]  . AFP tumor marker    Standing Status:   Future    Number of Occurrences:   1    Standing Expiration Date:   12/09/2018  . APTT    Standing Status:   Future    Number of Occurrences:   1    Standing Expiration Date:   12/09/2018  . Protime-INR    Standing Status:   Future    Number of Occurrences:   1    Standing Expiration Date:   12/09/2018  . Hepatitis panel, acute    Standing Status:   Future    Number of Occurrences:   1    Standing Expiration Date:   12/09/2018    All questions were answered. The patient knows to call the clinic with any problems, questions or concerns. Total time spent is 45 minutes with more than 50% of the time spent face-to-face discussing scan findings, possible diagnosis and  coordination of care.     Derek Jack, MD 12/09/2017 12:58 PM

## 2017-12-10 LAB — HEPATITIS PANEL, ACUTE
HCV Ab: >11 s/co ratio — ABNORMAL HIGH (ref 0.0–0.9)
Hep A IgM: NEGATIVE
Hep B C IgM: NEGATIVE
Hepatitis B Surface Ag: NEGATIVE

## 2017-12-10 LAB — AFP TUMOR MARKER: AFP, SERUM, TUMOR MARKER: 33.1 ng/mL — AB (ref 0.0–8.3)

## 2017-12-15 ENCOUNTER — Ambulatory Visit (HOSPITAL_COMMUNITY)
Admission: RE | Admit: 2017-12-15 | Discharge: 2017-12-15 | Disposition: A | Discharge status: Home / Self Care | Payer: BLUE CROSS/BLUE SHIELD | Source: Ambulatory Visit | Attending: Hematology | Admitting: Hematology

## 2017-12-15 ENCOUNTER — Encounter (HOSPITAL_COMMUNITY): Payer: Self-pay

## 2017-12-15 DIAGNOSIS — M899 Disorder of bone, unspecified: Secondary | ICD-10-CM | POA: Diagnosis not present

## 2017-12-15 DIAGNOSIS — E278 Other specified disorders of adrenal gland: Secondary | ICD-10-CM | POA: Insufficient documentation

## 2017-12-15 DIAGNOSIS — K7460 Unspecified cirrhosis of liver: Secondary | ICD-10-CM | POA: Diagnosis not present

## 2017-12-15 DIAGNOSIS — R160 Hepatomegaly, not elsewhere classified: Secondary | ICD-10-CM | POA: Diagnosis not present

## 2017-12-15 DIAGNOSIS — K7689 Other specified diseases of liver: Secondary | ICD-10-CM | POA: Diagnosis not present

## 2017-12-15 LAB — GLUCOSE, CAPILLARY: Glucose-Capillary: 92 mg/dL (ref 65–99)

## 2017-12-15 MED ORDER — FLUDEOXYGLUCOSE F - 18 (FDG) INJECTION
9.9000 | Freq: Once | INTRAVENOUS | Status: AC
  Administered 2017-12-15: 9.9 via INTRAVENOUS

## 2017-12-25 ENCOUNTER — Other Ambulatory Visit: Payer: Self-pay | Admitting: Radiology

## 2017-12-28 ENCOUNTER — Ambulatory Visit (HOSPITAL_COMMUNITY): Payer: BLUE CROSS/BLUE SHIELD | Admitting: Hematology

## 2017-12-28 ENCOUNTER — Other Ambulatory Visit: Payer: Self-pay | Admitting: Radiology

## 2017-12-29 ENCOUNTER — Encounter (HOSPITAL_COMMUNITY): Payer: Self-pay

## 2017-12-29 ENCOUNTER — Ambulatory Visit (HOSPITAL_COMMUNITY)
Admission: RE | Admit: 2017-12-29 | Discharge: 2017-12-29 | Disposition: A | Discharge status: Home / Self Care | Payer: BLUE CROSS/BLUE SHIELD | Source: Ambulatory Visit | Attending: Hematology | Admitting: Hematology

## 2017-12-29 DIAGNOSIS — Z981 Arthrodesis status: Secondary | ICD-10-CM | POA: Diagnosis not present

## 2017-12-29 DIAGNOSIS — R161 Splenomegaly, not elsewhere classified: Secondary | ICD-10-CM | POA: Diagnosis not present

## 2017-12-29 DIAGNOSIS — Z79899 Other long term (current) drug therapy: Secondary | ICD-10-CM | POA: Insufficient documentation

## 2017-12-29 DIAGNOSIS — I700 Atherosclerosis of aorta: Secondary | ICD-10-CM | POA: Insufficient documentation

## 2017-12-29 DIAGNOSIS — Z7982 Long term (current) use of aspirin: Secondary | ICD-10-CM | POA: Insufficient documentation

## 2017-12-29 DIAGNOSIS — N400 Enlarged prostate without lower urinary tract symptoms: Secondary | ICD-10-CM | POA: Diagnosis not present

## 2017-12-29 DIAGNOSIS — K7460 Unspecified cirrhosis of liver: Secondary | ICD-10-CM | POA: Diagnosis not present

## 2017-12-29 DIAGNOSIS — R590 Localized enlarged lymph nodes: Secondary | ICD-10-CM | POA: Insufficient documentation

## 2017-12-29 DIAGNOSIS — Z87891 Personal history of nicotine dependence: Secondary | ICD-10-CM | POA: Diagnosis not present

## 2017-12-29 DIAGNOSIS — I739 Peripheral vascular disease, unspecified: Secondary | ICD-10-CM | POA: Diagnosis not present

## 2017-12-29 DIAGNOSIS — B1920 Unspecified viral hepatitis C without hepatic coma: Secondary | ICD-10-CM | POA: Diagnosis not present

## 2017-12-29 DIAGNOSIS — E7800 Pure hypercholesterolemia, unspecified: Secondary | ICD-10-CM | POA: Insufficient documentation

## 2017-12-29 DIAGNOSIS — C787 Secondary malignant neoplasm of liver and intrahepatic bile duct: Secondary | ICD-10-CM | POA: Insufficient documentation

## 2017-12-29 DIAGNOSIS — R160 Hepatomegaly, not elsewhere classified: Secondary | ICD-10-CM | POA: Insufficient documentation

## 2017-12-29 DIAGNOSIS — R079 Chest pain, unspecified: Secondary | ICD-10-CM | POA: Diagnosis not present

## 2017-12-29 DIAGNOSIS — E279 Disorder of adrenal gland, unspecified: Secondary | ICD-10-CM | POA: Insufficient documentation

## 2017-12-29 DIAGNOSIS — M899 Disorder of bone, unspecified: Secondary | ICD-10-CM | POA: Diagnosis not present

## 2017-12-29 LAB — CBC
HCT: 42.1 % (ref 39.0–52.0)
HEMOGLOBIN: 14.8 g/dL (ref 13.0–17.0)
MCH: 32.7 pg (ref 26.0–34.0)
MCHC: 35.2 g/dL (ref 30.0–36.0)
MCV: 92.9 fL (ref 78.0–100.0)
PLATELETS: ADEQUATE 10*3/uL (ref 150–400)
RBC: 4.53 MIL/uL (ref 4.22–5.81)
RDW: 13.6 % (ref 11.5–15.5)
WBC: 4.9 10*3/uL (ref 4.0–10.5)

## 2017-12-29 LAB — PROTIME-INR
INR: 1.1
Prothrombin Time: 14.1 s (ref 11.4–15.2)

## 2017-12-29 MED ORDER — MIDAZOLAM HCL 2 MG/2ML IJ SOLN
INTRAMUSCULAR | Status: AC | PRN
  Administered 2017-12-29 (×2): 1 mg via INTRAVENOUS

## 2017-12-29 MED ORDER — LIDOCAINE HCL 1 % IJ SOLN
INTRAMUSCULAR | Status: AC
  Filled 2017-12-29: qty 20

## 2017-12-29 MED ORDER — FENTANYL CITRATE (PF) 100 MCG/2ML IJ SOLN
INTRAMUSCULAR | Status: AC | PRN
  Administered 2017-12-29 (×2): 50 ug via INTRAVENOUS

## 2017-12-29 MED ORDER — MIDAZOLAM HCL 2 MG/2ML IJ SOLN
INTRAMUSCULAR | Status: AC
  Filled 2017-12-29: qty 2

## 2017-12-29 MED ORDER — SODIUM CHLORIDE 0.9 % IV SOLN: INTRAVENOUS | Status: DC

## 2017-12-29 MED ORDER — FENTANYL CITRATE (PF) 100 MCG/2ML IJ SOLN
INTRAMUSCULAR | Status: AC
  Filled 2017-12-29: qty 2

## 2017-12-29 NOTE — Procedures (Signed)
Interventional Radiology Procedure Note  Procedure: CT guided biopsy of sternal mass.  Complications: None Recommendations:  - Ok to shower tomorrow - Do not submerge for 7 days - Routine care - 1 hour dc home   Signed,  Dulcy Fanny. Earleen Newport, DO

## 2017-12-29 NOTE — Sedation Documentation (Signed)
Bedrest started at  [place].

## 2017-12-29 NOTE — H&P (Signed)
Chief Complaint: Patient was seen in consultation today for sternal lesion biopsy at the request of [place]  Referring Physician(s): Katragadda,Sreedhar  Supervising Physician: Corrie Mckusick  Patient Status: [facility] - Out-pt  History of Present Illness: [name] is a 62 y.o. male   Pt presented with sternal chest pain Feels he injured it while exercising Work up revealed sternal mass and liver mass  CT 3/29: IMPRESSION: 1. The previously demonstrated large lesion involving the left hepatic lobe remains highly concerning for hepatocellular carcinoma, although is suboptimally characterized on this examination due to preceding contrast for enhanced chest CT. Further evaluation with abdominal MRI without and with contrast recommended. 2. Indeterminate left adrenal mass, potentially a metastasis. 3. Severe aortic atherosclerosis.  PET 4/9: IMPRESSION: 1. The large hepatic mass is not hypermetabolic. It is well-circumscribed and there appears to be a central scar. FNH is a possibility. Certainly with a background of severe cirrhosis this is still worrisome and may require biopsy. MR imaging with Gretta Cool is also another option. Correlation with alpha-fetoprotein level is suggested. 2. Mild hypermetabolism in the left adrenal gland lesion just above background mediastinal activity. This is indeterminate. It could be a low metabolically active metastatic focus or a lipid poor adenoma. 3. The sternal lesion is mildly hypermetabolic and concerning for metastatic focus. No other definite bone lesions are identified.   Now scheduled for sternal lesion biopsy  LD Plavix 1 week ago  Past Medical History:  Diagnosis Date  . High cholesterol   . PAD (peripheral artery disease) ([place])     Past Surgical History:  Procedure Laterality Date  . BACK SURGERY      Allergies: Patient has no known allergies.  Medications: Prior to Admission medications     Medication Sig Start Date End Date Taking? Authorizing Provider  aspirin EC 81 MG tablet Take 81 mg by mouth daily.   Yes [provider]  atorvastatin (LIPITOR) 40 MG tablet Take 40 mg by mouth daily.   Yes [provider]  capsaicin (ZOSTRIX) 0.025 % cream Apply 1 application topically 2 (two) times daily as needed.   Yes [provider]  clopidogrel (PLAVIX) 75 MG tablet Take 75 mg by mouth daily.   Yes [provider]  diclofenac sodium (VOLTAREN) 1 % GEL Apply 1 application topically 3 (three) times daily.   Yes [provider]  oxyCODONE (OXY IR/ROXICODONE) 5 MG immediate release tablet Take 2 tablets (10 mg total) by mouth every 6 (six) hours as needed for severe pain. 12/09/17  Yes Derek Jack, MD  Potassium 95 MG TABS Take 1 tablet by mouth daily as needed (Cramping).   Yes [provider]     Family History  Problem Relation Age of Onset  . Alzheimer's disease Mother   . Cancer Sister        breast    Social History   Socioeconomic History  . Marital status: Married    Spouse name: Not on file  . Number of children: Not on file  . Years of education: Not on file  . Highest education level: Not on file  Occupational History  . Not on file  Social Needs  . Financial resource strain: Not on file  . Food insecurity:    Worry: Not on file    Inability: Not on file  . Transportation needs:    Medical: Not on file    Non-medical: Not on file  Tobacco Use  . Smoking status: Former Smoker  Packs/day: 2.00    Years: 40.00    Pack years: 80.00    Types: Cigarettes    Last attempt to quit: 12/09/2012    Years since quitting: 5.0  . Smokeless tobacco: Never Used  Substance and Sexual Activity  . Alcohol use: Yes    Alcohol/week: 9.0 oz    Types: 15 Cans of beer per week    Comment: 6 3times a week  . Drug use: Never  . Sexual activity: Not on file  Lifestyle  . Physical activity:    Days per week: Not on  file    Minutes per session: Not on file  . Stress: Not on file  Relationships  . Social connections:    Talks on phone: Not on file    Gets together: Not on file    Attends religious service: Not on file    Active member of club or organization: Not on file    Attends meetings of clubs or organizations: Not on file    Relationship status: Not on file  Other Topics Concern  . Not on file  Social History Narrative  . Not on file    Review of Systems: A 12 point ROS discussed and pertinent positives are indicated in the HPI above.  All other systems are negative.  Review of Systems  Constitutional: Positive for activity change. Negative for appetite change, fatigue, fever and unexpected weight change.  Respiratory: Negative for cough and shortness of breath.   Cardiovascular: Positive for chest pain.  Gastrointestinal: Negative for abdominal pain.  Musculoskeletal: Negative for back pain.  Neurological: Negative for weakness.  Psychiatric/Behavioral: Negative for behavioral problems and confusion.    Vital Signs: BP 123/86   Pulse 71   Temp 97.9 F (36.6 C) (Oral)   Resp 16   Ht 6\' 1"  (1.854 m)   Wt 180 lb (81.6 kg)   SpO2 97%   BMI 23.75 kg/m   Physical Exam  Constitutional: He is oriented to person, place, and time.  Cardiovascular: Normal rate, regular rhythm and normal heart sounds.  Pulmonary/Chest: Effort normal and breath sounds normal.  Abdominal: Soft. Bowel sounds are normal.  Musculoskeletal: Normal range of motion. He exhibits tenderness.  Painful sternal area  Neurological: He is alert and oriented to person, place, and time.  Skin: Skin is warm and dry.  Psychiatric: He has a normal mood and affect. His behavior is normal. Judgment and thought content normal.  Nursing note and vitals reviewed.   Imaging: Dg Chest 2 View  Result Date: 12/04/2017 CLINICAL DATA:  Chest pain following heavy exercise EXAM: CHEST - 2 VIEW COMPARISON:  None. FINDINGS:  There is interstitial thickening and scattered areas of scarring bilaterally. There is no edema or consolidation. The heart size and pulmonary vascularity are normal. No adenopathy. No pneumothorax. No bone lesions. IMPRESSION: Scattered areas of scarring and interstitial thickening bilaterally. No edema or consolidation. Heart size normal. No pneumothorax. Electronically Signed   By: Lowella Grip III M.D.   On: 12/04/2017 08:34   Ct Chest W Contrast  Result Date: 12/04/2017 CLINICAL DATA:  Chest pain for 3 weeks after physical exercise. Possible muscle spasm. EXAM: CT CHEST WITH CONTRAST TECHNIQUE: Multidetector CT imaging of the chest was performed during intravenous contrast administration. CONTRAST:  19mL ISOVUE-300 IOPAMIDOL (ISOVUE-300) INJECTION 61% COMPARISON:  Chest x-ray 12/04/2017 FINDINGS: Cardiovascular: Heart is normal in size. Minimal calcified plaque over the aortic arch. Visualized pulmonary arterial system is within normal. Mediastinum/Nodes: No mediastinal or hilar  adenopathy. Lungs/Pleura: Lungs are adequately inflated with minimal atelectasis/scarring along the minor fissure. No lobar consolidation or effusion. Minimal calcified pleural plaque over the anterior mid thorax bilaterally. Airways are within normal. Upper Abdomen: Examination demonstrates evidence of cirrhosis and mild splenomegaly. There is a large heterogeneous mass occupying the medial segment of the left lobe of the liver with surrounding increased density of the adjacent parenchyma. This mass measures approximately 7.8 x 8.5 cm in AP and transverse dimension and appears to have central scar and fairly well-defined capsule. There is a suggestion of several small lymph nodes along the diaphragmatic border just above this mass as well as mild adenopathy over the porta hepatis and gastrohepatic ligament. This likely represents a hepatocellular carcinoma in the setting of cirrhosis. There is a 3.4 cm left adrenal mass as  well as 2 small right adrenal masses with the larger measuring 1.5 cm likely metastatic disease. Calcified plaque over the abdominal aorta. Musculoskeletal: Mild degenerate change of the spine. Lytic lesion over the inferior body of the sternum compatible with metastatic disease. IMPRESSION: No acute cardiopulmonary disease. 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 3 mm nodule left lower lobe likely benign, although recommend attention on follow-up. Large heterogeneous left liver mass measuring 7.8 x 8.5 cm in the setting of cirrhosis. This likely represents hepatocellular carcinoma. There is adjacent adenopathy as described adjacent the diaphragm and in the upper abdomen. Bilateral adrenal masses likely metastatic disease. Lytic lesion over the sternum likely metastatic disease. Aortic Atherosclerosis (ICD10-I70.0). Electronically Signed   By: Marin Olp M.D.   On: 12/04/2017 10:40   Ct Abdomen Pelvis W Contrast  Result Date: 12/04/2017 CLINICAL DATA:  Chest pain for 3 weeks. Pain with motion and breathing. Hepatic and adrenal masses on chest CT. EXAM: CT ABDOMEN AND PELVIS WITH CONTRAST TECHNIQUE: Multidetector CT imaging of the abdomen and pelvis was performed using the standard protocol following bolus administration of intravenous contrast. CONTRAST:  66mL ISOVUE-300 IOPAMIDOL (ISOVUE-300) INJECTION 61% COMPARISON:  Chest CT earlier the same date. FINDINGS: Lower chest: Mild emphysema at both lung bases. No significant pleural or pericardial effusion. Hepatobiliary: Morphologic changes of hepatic cirrhosis are again noted. Exophytic lesion involving the medial segment of the left hepatic lobe is again noted, measuring up to 13.8 x 11.5 cm on image 17/2. This is suboptimally evaluated due to the contrast administered for the chest CT 2 hours ago. It demonstrated heterogeneous enhancement on the chest CT and probable central irregular scarring. There is some washout on the  delayed phase images. These features remain highly concerning for hepatocellular carcinoma. There is possible extracapsular extension of tumor superiorly around the IVC, as correlated with the chest CT. Mild gallbladder wall thickening. No evidence of calcified gallstones or biliary dilatation. Pancreas: Unremarkable. No pancreatic ductal dilatation or surrounding inflammatory changes. Spleen: Normal in size without focal abnormality. Adrenals/Urinary Tract: 3.0 x 2.5 cm left adrenal nodule measures 83 HU. This demonstrates nonspecific washout, measuring 64 HU on the delayed phase images through the kidneys. There is minimal nodularity of the right adrenal gland. No suspicious renal findings. There is probable mild renal cortical scarring bilaterally. The bladder appears unremarkable. Stomach/Bowel: No evidence of bowel wall thickening, distention or surrounding inflammatory change. The appendix appears normal. Vascular/Lymphatic: There are small lymph nodes in the porta hepatis, not pathologically enlarged. Aortic and branch vessel atherosclerosis with a large calcified component in the mid aorta (image 41/2). The portal, superior mesenteric and splenic veins are patent. The hepatic veins are not  well visualized. Reproductive: Mild enlargement of the prostate gland. The seminal vesicles appear normal. Other: Trace perihepatic ascites with mild mesenteric edema. The anterior abdominal wall is intact. Musculoskeletal: No acute or significant osseous findings. Mild lumbar spine degenerative changes status post fusion from L3 through L5. IMPRESSION: 1. The previously demonstrated large lesion involving the left hepatic lobe remains highly concerning for hepatocellular carcinoma, although is suboptimally characterized on this examination due to preceding contrast for enhanced chest CT. Further evaluation with abdominal MRI without and with contrast recommended. 2. Indeterminate left adrenal mass, potentially a  metastasis. 3. Severe aortic atherosclerosis. Electronically Signed   By: Richardean Sale M.D.   On: 12/04/2017 12:31   Nm Pet Image Initial (pi) Skull Base To Thigh  Result Date: 12/16/2017 CLINICAL DATA:  Initial treatment strategy for liver mass. History of hepatitis-C and cirrhosis. EXAM: NUCLEAR MEDICINE PET SKULL BASE TO THIGH TECHNIQUE: 9.9 mCi F-18 FDG was injected intravenously. Full-ring PET imaging was performed from the skull base to thigh after the radiotracer. CT data was obtained and used for attenuation correction and anatomic localization. Fasting blood glucose: 92 mg/dl COMPARISON:  None. FINDINGS: Mediastinal blood pool activity: SUV max 2.21 NECK: Symmetric hypermetabolism noted in the tonsillar regions bilaterally. This could be due to inflammation. No masses identified. No adenopathy in the neck. Incidental CT findings: none CHEST: No enlarged or hypermetabolic mediastinal or hilar lymph nodes. No worrisome pulmonary lesions on the CT scan. Calcified pleural plaques are again demonstrated. Incidental CT findings: none ABDOMEN/PELVIS: No hypermetabolism is demonstrated in the large left hepatic lobe masslike lesion at the hepatic dome. It appears to have a central scar. FNH is possible. Recommend correlation with alpha-fetoprotein level. Biopsy may be indicated. There is a questionable small nodule in the left hepatic lobe which is mildly hypermetabolic with SUV max of 4.3. The left adrenal gland lesion has an SUV max of 2.97 which is just above background mediastinal activity and this may be a lipid poor adenoma. Numerous borderline enlarged upper abdominal lymph nodes are not hypermetabolic and typical with hepatitis C and cirrhosis. Incidental CT findings: none SKELETON: The sternal lesion demonstrates mild hypermetabolism. SUV max is 4.17. I do not see any other definite osseous lesions. Incidental CT findings: none IMPRESSION: 1. The large hepatic mass is not hypermetabolic. It is  well-circumscribed and there appears to be a central scar. FNH is a possibility. Certainly with a background of severe cirrhosis this is still worrisome and may require biopsy. MR imaging with Gretta Cool is also another option. Correlation with alpha-fetoprotein level is suggested. 2. Mild hypermetabolism in the left adrenal gland lesion just above background mediastinal activity. This is indeterminate. It could be a low metabolically active metastatic focus or a lipid poor adenoma. 3. The sternal lesion is mildly hypermetabolic and concerning for metastatic focus. No other definite bone lesions are identified. Electronically Signed   By: Marijo Sanes M.D.   On: 12/16/2017 10:06    Labs:  CBC: Recent Labs    12/04/17 0921 12/29/17 0630  WBC 4.0 4.9  HGB 14.1 14.8  HCT 40.5 42.1  PLT 82* PENDING    COAGS: Recent Labs    12/09/17 1100 12/29/17 0630  INR 1.11 1.10  APTT 30  --     BMP: Recent Labs    12/04/17 0921  NA 135  K 4.6  CL 103  CO2 24  GLUCOSE 117*  BUN 16  CALCIUM 8.7*  CREATININE 1.08  GFRNONAA >60  GFRAA >60  LIVER FUNCTION TESTS: Recent Labs    12/04/17 0921  BILITOT 1.0  AST 84*  ALT 51  ALKPHOS 79  PROT 7.3  ALBUMIN 2.8*    TUMOR MARKERS: No results for input(s): AFPTM, CEA, CA199, CHROMGRNA in the last 8760 hours.  Assessment and Plan:  Liver mass; sternal mass +PET Now scheduled for sternal mass biopsy Risks and benefits discussed with the patient including, but not limited to bleeding, infection, damage to adjacent structures or low yield requiring additional tests.  All of the patient's questions were answered, patient is agreeable to proceed. Consent signed and in chart.   Thank you for this interesting consult.  I greatly enjoyed meeting EULALIO REAMY and look forward to participating in their care.  A copy of this report was sent to the requesting provider on this date.  Electronically Signed: Alois Mincer A,  PA-C 12/29/2017, 8:00 AM   I spent a total of  30 Minutes   in face to face in clinical consultation, greater than 50% of which was counseling/coordinating care for sternal lesion bx

## 2017-12-29 NOTE — Discharge Instructions (Signed)
Moderate Conscious Sedation, Adult, Care After These instructions provide you with information about caring for yourself after your procedure. Your health care provider may also give you more specific instructions. Your treatment has been planned according to current medical practices, but problems sometimes occur. Call your health care provider if you have any problems or questions after your procedure. What can I expect after the procedure? After your procedure, it is common:  To feel sleepy for several hours.  To feel clumsy and have poor balance for several hours.  To have poor judgment for several hours.  To vomit if you eat too soon.  Follow these instructions at home: For at least 24 hours after the procedure:   Do not: ? Participate in activities where you could fall or become injured. ? Drive. ? Use heavy machinery. ? Drink alcohol. ? Take sleeping pills or medicines that cause drowsiness. ? Make important decisions or sign legal documents. ? Take care of children on your own.  Rest. Eating and drinking  Follow the diet recommended by your health care provider.  If you vomit: ? Drink water, juice, or soup when you can drink without vomiting. ? Make sure you have little or no nausea before eating solid foods. General instructions  Have a responsible adult stay with you until you are awake and alert.  Take over-the-counter and prescription medicines only as told by your health care provider.  If you smoke, do not smoke without supervision.  Keep all follow-up visits as told by your health care provider. This is important. Contact a health care provider if:  You keep feeling nauseous or you keep vomiting.  You feel light-headed.  You develop a rash.  You have a fever. Get help right away if:  You have trouble breathing. This information is not intended to replace advice given to you by your health care provider. Make sure you discuss any questions you have  with your health care provider. Document Released: 06/15/2013 Document Revised: 01/28/2016 Document Reviewed: 12/15/2015 Elsevier Interactive Patient Education  2018 [employer].   Needle Biopsy of the Bone, Care After Refer to this sheet in the next few weeks. These instructions provide you with information about caring for yourself after your procedure. Your health care provider may also give you more specific instructions. Your treatment has been planned according to current medical practices, but problems sometimes occur. Call your health care provider if you have any problems or questions after your procedure. What can I expect after the procedure? After your procedure, it is common to have soreness or tenderness at the puncture site. Follow these instructions at home:  Take over-the-counter and prescription medicines only as told by your health care provider.  Bathe and shower as told by your health care provider.  Follow instructions from your health care provider about: ? How to take care of your puncture site. ? When and how you should change your bandage (dressing). ? When you should remove your dressing.  Check your puncture site every day for signs of infection. Watch for: ? Redness, swelling, or worsening pain. ? Fluid, blood, or pus.  Return to your normal activities as told by your health care provider.  Keep all follow-up visits as told by your health care provider. This is important. Contact a health care provider if:  You have redness, swelling, or worsening pain at the site of your puncture.  You have fluid, blood, or pus coming from your puncture site.  You have a fever.  You have persistent nausea or vomiting. Get help right away if:  You develop a rash.  You have difficulty breathing. This information is not intended to replace advice given to you by your health care provider. Make sure you discuss any questions you have with your health care  provider. Document Released: 03/14/2005 Document Revised: 01/31/2016 Document Reviewed: 10/02/2014 Elsevier Interactive Patient Education  [employer].

## 2018-01-07 ENCOUNTER — Inpatient Hospital Stay (HOSPITAL_COMMUNITY): Payer: BLUE CROSS/BLUE SHIELD | Attending: Hematology | Admitting: Hematology

## 2018-01-07 ENCOUNTER — Encounter (HOSPITAL_COMMUNITY): Payer: Self-pay | Admitting: Hematology

## 2018-01-07 ENCOUNTER — Other Ambulatory Visit (HOSPITAL_COMMUNITY): Payer: Self-pay | Admitting: Emergency Medicine

## 2018-01-07 ENCOUNTER — Encounter (HOSPITAL_COMMUNITY): Payer: Self-pay | Admitting: Emergency Medicine

## 2018-01-07 ENCOUNTER — Encounter (HOSPITAL_COMMUNITY): Payer: Self-pay | Admitting: Lab

## 2018-01-07 VITALS — BP 134/93 | HR 82 | Temp 97.7°F | Resp 20 | Ht 73.0 in | Wt 180.6 lb

## 2018-01-07 DIAGNOSIS — D696 Thrombocytopenia, unspecified: Secondary | ICD-10-CM | POA: Insufficient documentation

## 2018-01-07 DIAGNOSIS — R197 Diarrhea, unspecified: Secondary | ICD-10-CM | POA: Diagnosis not present

## 2018-01-07 DIAGNOSIS — C220 Liver cell carcinoma: Secondary | ICD-10-CM | POA: Insufficient documentation

## 2018-01-07 DIAGNOSIS — G893 Neoplasm related pain (acute) (chronic): Secondary | ICD-10-CM | POA: Insufficient documentation

## 2018-01-07 DIAGNOSIS — Z7982 Long term (current) use of aspirin: Secondary | ICD-10-CM | POA: Diagnosis not present

## 2018-01-07 DIAGNOSIS — Z79899 Other long term (current) drug therapy: Secondary | ICD-10-CM | POA: Diagnosis not present

## 2018-01-07 DIAGNOSIS — C7971 Secondary malignant neoplasm of right adrenal gland: Secondary | ICD-10-CM | POA: Insufficient documentation

## 2018-01-07 DIAGNOSIS — R160 Hepatomegaly, not elsewhere classified: Secondary | ICD-10-CM

## 2018-01-07 DIAGNOSIS — Z87891 Personal history of nicotine dependence: Secondary | ICD-10-CM | POA: Diagnosis not present

## 2018-01-07 DIAGNOSIS — R0789 Other chest pain: Secondary | ICD-10-CM | POA: Insufficient documentation

## 2018-01-07 DIAGNOSIS — C7951 Secondary malignant neoplasm of bone: Secondary | ICD-10-CM | POA: Diagnosis not present

## 2018-01-07 DIAGNOSIS — R634 Abnormal weight loss: Secondary | ICD-10-CM | POA: Insufficient documentation

## 2018-01-07 DIAGNOSIS — C7972 Secondary malignant neoplasm of left adrenal gland: Secondary | ICD-10-CM | POA: Insufficient documentation

## 2018-01-07 DIAGNOSIS — M898X9 Other specified disorders of bone, unspecified site: Secondary | ICD-10-CM

## 2018-01-07 MED ORDER — OXYCODONE HCL 15 MG PO TABA: 1.0000 | ORAL_TABLET | Freq: Four times a day (QID) | ORAL | 0 refills | Status: DC | PRN

## 2018-01-07 MED ORDER — OXYCODONE HCL 15 MG PO TABS: 15.0000 mg | ORAL_TABLET | Freq: Four times a day (QID) | ORAL | 0 refills | Status: DC | PRN

## 2018-01-07 MED ORDER — LENVATINIB (12 MG DAILY DOSE) 3 X 4 MG PO CPPK: 12.0000 mg | ORAL_CAPSULE | Freq: Every day | ORAL | 0 refills | Status: DC

## 2018-01-07 NOTE — Patient Instructions (Signed)
[facility]   CHEMOTHERAPY INSTRUCTIONS  You have been diagnosed with metastatic liver cancer. We are going to start you on an oral medication call lenvima.  You will take 3 capsules (12 mg) daily.  Make sure to take this at the same time each day and it can be taken with or without food.  This treatment is with palliative intent, which means you are treatable but not curable.  You will see the doctor throughout treatment, he will monitor your progress by labs, scans, and by the way you are feeling.     POTENTIAL SIDE EFFECTS OF TREATMENT: Lenvatinib (Lenvima)  About This Drug Lenvatinib is used to treat cancer. It is given by orally (by mouth).  Possible Side Effects . Changes in your thyroid function . Voice disorder . Soreness of the mouth and throat. You may have red areas, white patches, or sores that hurt. . Nausea and vomiting (throwing up) . Decreased appetite (decreased hunger) . Diarrhea (loose bowel movements) . Pain in your abdomen . Abnormal bleeding . Tiredness . Swelling of your legs, ankles and/or feet . Weight loss . Increased protein in your urine . Bone, joint and muscle pain . Headache . Cough, trouble breathing . Hand-and-foot syndrome. The palms of your hands or soles of your feet may tingle, become numb, painful, swollen, or red. . Rash . High blood pressure Note: Each of the side effects above was reported in 30% or greater of patients treated with lenvatinib. Not all possible side effects are included above.   Warnings and Precautions . Severe high blood pressure . Congestive heart failure (your heart has less ability to pump blood properly) and other changes in your heart function which can be life-threatening . Abnormal heartbeat/EKG (electrocardiogram) . Blood clots and events such as stroke and heart attack. A blood clot in your leg may cause your leg to swell, appear red and warm, and/or cause pain. A blood clot  in your lungs may cause trouble breathing, pain when breathing, and/or chest pain. . Changes in your liver function, which can cause liver failure and be life-threatening . Severe diarrhea . Risk of gastrointestinal perforation, which is a hole in your stomach, small, and/or large intestine . Abnormal opening in stomach, intestine or esophagus (fistula). Symptoms of a fistula may be: severe abdominal pain or difficulty swallowing . Changes in your thyroid function . Changes in your kidney function, which can cause renal failure and be life-threatening . Changes in your central nervous system can happen. The central nervous system is made up of your brain and spinal cord. You could feel extreme tiredness, agitation, confusion, hallucinations (see or hear things that are not there), have trouble understanding or speaking, loss of control of your bowels or bladder, eyesight changes, numbness or lack of strength to your arms, legs, face, or body, seizures or coma. If you start to have any of these symptoms let your doctor know right away. . Abnormal bleeding which can be life-threatening - symptoms may be coughing up blood, throwing up blood (may look like coffee grounds), red or black tarry bowel movements, abnormally heavy menstrual flow, nosebleeds or any other unusual bleeding. . Severe low calcium, which can be life-threatening. You may experience numbness or tingling around your mouth or in your hands or feet. Other symptoms of low calcium are muscle stiffness, twitching, spasms, or cramps. . Slow wound healing Note: Some of the side effects above are very rare. If you have concerns  and/or questions, please discuss them with your medical team.  Important Information . Lenvatinib may cause slow wound healing. If you must have emergency surgery or have an accident that results in a wound, tell the doctor that you are on lenvatinib.  How to Take Your Medication . Swallow the medicine whole with or  without food, at the same time each day. . If you have trouble swallowing, you can dissolve the capsules in 1 tablespoon of water or apple juice. Put the capsules whole in the liquid and let stand for at least 10 minutes. Stir for at least 3 minutes and drink the mixture. After drinking, add 1 tablespoon of water or apple juice to the glass, stir and drink the liquid right away. . Missed dose: If you miss a dose, take it as soon as you think about it. If it is within 12 hours of your next dose, then skip the missed dose. Do not take 2 doses at the same time and do not double up on the next dose. Instead, continue with your regular dosing schedule and contact your physician. . Handling: Wash your hands after handling your medicine, your caretakers should not handle your medicine with bare hands and should wear latex gloves. . This drug may be present in the saliva, tears, sweat, urine, stool, vomit, semen, and vaginal secretions. Talk to your doctor and/or your nurse about the necessary precautions to take during this time. . Storage: Store this medicine in the original container at room temperature. . Disposal of unused medicine: Do not flush any expired and/or unused medicine down the toilet or drain unless you are specifically instructed to do so on the medication label. Some facilities have unused medication take-back programs and/or other options. If you do not have a take-back program in your area, then please discuss with your nurse or your doctor how to dispose of unused medicine.  Treating Side Effects . Manage tiredness by pacing your activities for the day. . Be sure to include periods of rest between energy-draining activities. . Drink plenty of fluids (a minimum of eight glasses per day is recommended). . If you throw up or have loose bowel movements, you should drink more fluids so that you do not become dehydrated (lack of water in the body from losing too much fluid). . To help with  nausea and vomiting, eat small, frequent meals instead of three large meals a day. Choose foods and drinks that are at room temperature. Ask your nurse or doctor about other helpful tips and medicine that is available to help or stop lessen these symptoms. . If you get diarrhea, eat low-fiber foods that are high in protein and calories and avoid foods that can irritate your digestive tracts or lead to cramping. . Ask your nurse or doctor about medicine that can lessen or stop your diarrhea. . Mouth care is very important. Your mouth care should consist of routine, gentle cleaning of your teeth or dentures and rinsing your mouth with a mixture of 1/2 teaspoon of salt in 8 ounces of water or 1/2 teaspoon of baking soda in 8 ounces of water. This should be done at least after each meal and at bedtime. . If you have mouth sores, avoid mouthwash that has alcohol. Also avoid alcohol and smoking because they can bother your mouth and throat. . To help with weight loss, drink fluids that contribute calories (whole milk, juice, soft drinks, sweetened beverages, milkshakes, and nutritional supplements) instead of water. . Include  a source of protein at every meal and snack, such as meat, poultry, fish, dry beans, tofu, eggs, nuts, milk, yogurt, cheese, ice cream, pudding, and nutritional supplements. . To help with decreased appetite, eat high calorie food such as pudding, ice cream, yogurt and milkshakes. Marland Kitchen Keeping your pain under control is important to your well-being. Please tell your doctor or nurse if you are experiencing pain. . If you get a rash do not put anything on it unless your doctor or nurse says you may. Keep the area around the rash clean and dry. Ask your doctor for medicine if your rash bothers you.  Food and Drug Interactions . There are no known interactions of lenvatinib with food. . This drug may interact with other medicines. Tell your doctor and pharmacist about all the prescription and  over-the-counter medicines and dietary supplements (vitamins, minerals, herbs and others) that you are taking at this time. Also, check with your doctor or pharmacist before starting any new prescription or over-the-counter medicines, or dietary supplements to make sure that there are no interactions. . The safety and use of dietary supplements and alternative diets are often not known. Using these might affect your cancer or interfere with your treatment. Do not use dietary supplements or alternative diets without your cancer doctor's help.  When to Call the Doctor Call your doctor or nurse if you have any of these symptoms and/or any new or unusual symptoms: . Fever of 100.4 F (38 C) or higher . Chills . A headache that does not go away . You cough up yellow, green, or bloody mucus . Coughing up blood . Wheezing or trouble breathing . Chest pain or symptoms of a heart attack. Most heart attacks involve pain in the center of the chest that lasts more than a few minutes. The pain may go away and come back. It can feel like pressure, squeezing, fullness, or pain. Sometimes pain is felt in one or both arms, the back, neck, jaw, or stomach. If any of these symptoms last 2 minutes, call 911. Marland Kitchen Confusion and/or agitation . Seizures . Hallucinations . Trouble understanding or speaking . Blurry vision or changes in your eyesight . Numbness or lack of strength to your arms, legs, face, or body . Pain in your chest . Feeling that your heart is beating in a fast or not normal way (palpitations) . Diarrhea 4 times in a day or loose bowel movements with lack of strength or a feeling of being dizzy . Blood in your stool . Pain in your abdomen that does not go away . Difficulty swallowing . Nausea that stops you from eating or drinking or relieved by prescribed medicine . Throwing up more than 3 times a day . Signs of low calcium: numbness or tingling around your mouth or in your hands or feet, muscle  stiffness, twitching, spasms, or cramps. . Decreased urine . Blood in your urine, vomit (bright red or coffee-ground) and/or stools (bright red, or black/tarry) . Fatigue that interferes with your daily activities . Unexplained weight gain . A new rash or a rash that is not relieved by prescribed medicines . Your leg or arm is swollen, red, warm and/or painful . Pain that does not go away, or is not relieved by prescribed medicines . Signs of possible liver problems: dark urine, pale bowel movements, bad stomach pain, feeling very tired and weak, unusual itching, or yellowing of the eyes or skin . Weight gain of 5 pounds in one week (  fluid retention) . Swelling of your legs, ankles and/or feet . Symptoms of a stroke such as sudden numbness or weakness of your face, arm, or leg, mostly on one side of your body; sudden confusion, trouble speaking or understanding; sudden trouble seeing in one or both eyes; sudden trouble walking, feeling dizzy, loss of balance or coordination; or sudden, bad headache with no known cause. If you have any of these symptoms for 2 minutes, call 911. . If you think you may be pregnant  Reproduction Warnings . Pregnancy warning: This drug can have harmful effects on the unborn baby. Women of childbearing potential should use effective methods of birth control during your cancer treatment and for at least 30 days after treatment. Let your doctor know right away if you think you may be pregnant. . Breastfeeding warning: Women should not breastfeed during treatment and for at least 1 week after treatment because this drug could enter the breast milk and cause harm to a breastfeeding baby. . Fertility warning: In men and women both, this drug may affect your ability to have children in the future. Talk with your doctor or nurse if you plan to have children. Ask for information on sperm or egg banking.      SELF CARE ACTIVITIES WHILE ON CHEMOTHERAPY: Hydration Increase  your fluid intake 48 hours prior to treatment and drink at least 8 to 12 cups (64 ounces) of water/decaff beverages per day after treatment. You can still have your cup of coffee or soda but these beverages do not count as part of your 8 to 12 cups that you need to drink daily. No alcohol intake.  Medications Continue taking your normal prescription medication as prescribed.  If you start any new herbal or new supplements please let us know first to make sure it is safe.  Mouth Care Have teeth cleaned professionally before starting treatment. Keep dentures and partial plates clean. Use soft toothbrush and do not use mouthwashes that contain alcohol. Biotene is a good mouthwash that is available at most pharmacies or may be ordered by calling [phone]. Use warm salt water gargles (1 teaspoon salt per 1 quart warm water) before and after meals and at bedtime. Or you may rinse with 2 tablespoons of three-percent hydrogen peroxide mixed in eight ounces of water. If you are still having problems with your mouth or sores in your mouth please call the clinic. If you need dental work, please let the doctor know before you go for your appointment so that we can coordinate the best possible time for you in regards to your chemo regimen. You need to also let your dentist know that you are actively taking chemo. We may need to do labs prior to your dental appointment.  Skin Care Always use sunscreen that has not expired and with SPF (Sun Protection Factor) of 50 or higher. Wear hats to protect your head from the sun. Remember to use sunscreen on your hands, ears, face, & feet.  Use good moisturizing lotions such as udder cream, eucerin, or even Vaseline. Some chemotherapies can cause dry skin, color changes in your skin and nails.    . Avoid long, hot showers or baths. . Use gentle, fragrance-free soaps and laundry detergent. . Use moisturizers, preferably creams or ointments rather than lotions because the  thicker consistency is better at preventing skin dehydration. Apply the cream or ointment within 15 minutes of showering. Reapply moisturizer at night, and moisturize your hands every time after you wash  them.  Hair Loss (if your doctor says your hair will fall out)  . If your doctor says that your hair is likely to fall out, decide before you begin chemo whether you want to wear a wig. You may want to shop before treatment to match your hair color. . Hats, turbans, and scarves can also camouflage hair loss, although some people prefer to leave their heads uncovered. If you go bare-headed outdoors, be sure to use sunscreen on your scalp. . Cut your hair short. It eases the inconvenience of shedding lots of hair, but it also can reduce the emotional impact of watching your hair fall out. . Don't perm or color your hair during chemotherapy. Those chemical treatments are already damaging to hair and can enhance hair loss. Once your chemo treatments are done and your hair has grown back, it's OK to resume dyeing or perming hair. With chemotherapy, hair loss is almost always temporary. But when it grows back, it may be a different color or texture. In older adults who still had hair color before chemotherapy, the new growth may be completely gray.  Often, new hair is very fine and soft.  Infection Prevention Please wash your hands for at least 30 seconds using warm soapy water. Handwashing is the #1 way to prevent the spread of germs. Stay away from sick people or people who are getting over a cold. If you develop respiratory systems such as green/yellow mucus production or productive cough or persistent cough let us know and we will see if you need an antibiotic. It is a good idea to keep a pair of gloves on when going into grocery stores/[employer] to decrease your risk of coming into contact with germs on the carts, etc. Carry alcohol hand gel with you at all times and use it frequently if out in public. If  your temperature reaches 100.5 or higher please call the clinic and let us know.  If it is after hours or on the weekend please go to the ER if your temperature is over 100.5.  Please have your own personal thermometer at home to use.    Sex and bodily fluids If you are going to have sex, a condom must be used to protect the person that isn't taking chemotherapy. Chemo can decrease your libido (sex drive). For a few days after chemotherapy, chemotherapy can be excreted through your bodily fluids.  When using the toilet please close the lid and flush the toilet twice.  Do this for a few day after you have had chemotherapy.   Effects of chemotherapy on your sex life Some changes are simple and won't last long. They won't affect your sex life permanently. Sometimes you may feel: . too tired . not strong enough to be very active . sick or sore  . not in the mood . anxious or low Your anxiety might not seem related to sex. For example, you may be worried about the cancer and how your treatment is going. Or you may be worried about money, or about how you family are coping with your illness. These things can cause stress, which can affect your interest in sex. It's important to talk to your partner about how you feel. Remember - the changes to your sex life don't usually last long. There's usually no medical reason to stop having sex during chemo. The drugs won't have any long term physical effects on your performance or enjoyment of sex. Cancer can't be passed on to your  partner during sex  Contraception It's important to use reliable contraception during treatment. Avoid getting pregnant while you or your partner are having chemotherapy. This is because the drugs may harm the baby. Sometimes chemotherapy drugs can leave a man or woman infertile.  This means you would not be able to have children in the future. You might want to talk to someone about permanent infertility. It can be very difficult to  learn that you may no longer be able to have children. Some people find counselling helpful. There might be ways to preserve your fertility, although this is easier for men than for women. You may want to speak to a fertility expert. You can talk about sperm banking or harvesting your eggs. You can also ask about other fertility options, such as donor eggs. If you have or have had breast cancer, your doctor might advise you not to take the contraceptive pill. This is because the hormones in it might affect the cancer.  It is not known for sure whether or not chemotherapy drugs can be passed on through semen or secretions from the vagina. Because of this some doctors advise people to use a barrier method if you have sex during treatment. This applies to vaginal, anal or oral sex. Generally, doctors advise a barrier method only for the time you are actually having the treatment and for about a week after your treatment. Advice like this can be worrying, but this does not mean that you have to avoid being intimate with your partner. You can still have close contact with your partner and continue to enjoy sex.  Animals If you have cats or birds we just ask that you not change the litter or change the cage.  Please have someone else do this for you while you are on chemotherapy.   Food Safety During and After Cancer Treatment Food safety is important for people both during and after cancer treatment. Cancer and cancer treatments, such as chemotherapy, radiation therapy, and stem cell/bone marrow transplantation, often weaken the immune system. This makes it harder for your body to protect itself from foodborne illness, also called food poisoning. Foodborne illness is caused by eating food that contains harmful bacteria, parasites, or viruses.    Foods to avoid Some foods have a higher risk of becoming tainted with bacteria. These include: Marland Kitchen Unwashed fresh fruit and vegetables, especially leafy vegetables  that can hide dirt and other contaminants . Raw sprouts, such as alfalfa sprouts . Raw or undercooked beef, especially ground beef, or other raw or undercooked meat and poultry . Fatty, fried, or spicy foods immediately before or after treatment.  These can sit heavy on your stomach and make you feel nauseous. . Raw or undercooked shellfish, such as oysters. . Sushi and sashimi, which often contain raw fish.  . Unpasteurized beverages, such as unpasteurized fruit juices, raw milk, raw yogurt, or cider . Undercooked eggs, such as soft boiled, over easy, and poached; raw, unpasteurized eggs; or foods made with raw egg, such as homemade raw cookie dough and homemade mayonnaise Simple steps for food safety Shop smart. . Do not buy food stored or displayed in an unclean area. . Do not buy bruised or damaged fruits or vegetables. . Do not buy cans that have cracks, dents, or bulges. . Pick up foods that can spoil at the end of your shopping trip and store them in a cooler on the way home. Prepare and clean up foods carefully. . Rinse all fresh  fruits and vegetables under running water, and dry them with a clean towel or paper towel. . Clean the top of cans before opening them. . After preparing food, wash your hands for 20 seconds with hot water and soap. Pay special attention to areas between fingers and under nails. . Clean your utensils and dishes with hot water and soap. Marland Kitchen Disinfect your kitchen and cutting boards using 1 teaspoon of liquid, unscented bleach mixed into 1 quart of water.   Dispose of old food. . Eat canned and packaged food before its expiration date (the "use by" or "best before" date). . Consume refrigerated leftovers within 3 to 4 days. After that time, throw out the food. Even if the food does not smell or look spoiled, it still may be unsafe. Some bacteria, such as Listeria, can grow even on foods stored in the refrigerator if they are kept for too long. Take precautions  when eating out. . At restaurants, avoid buffets and salad bars where food sits out for a long time and comes in contact with many people. Food can become contaminated when someone with a virus, often a norovirus, or another "bug" handles it. . Put any leftover food in a "to-go" container yourself, rather than having the server do it. And, refrigerate leftovers as soon as you get home. . Choose restaurants that are clean and that are willing to prepare your food as you order it cooked.    MEDICATIONS:                                                                                                                                                          Zofran/Ondansetron 8mg  tablet. Take 1 tablet every 8 hours as needed for nausea/vomiting. (#1 nausea med to take, this can constipate)  Compazine/Prochlorperazine 10mg  tablet. Take 1 tablet every 6 hours as needed for nausea/vomiting. (#2 nausea med to take, this can make you sleepy)  Over-the-Counter Meds: Miralax 1 capful in 8 oz of fluid daily. May increase to two times a day if needed. This is a stool softener. If this doesn't work proceed you can add:  Senokot S-start with 1 tablet two times a day and increase to 4 tablets two times a day if needed. (total of 8 tablets in a 24 hour period). This is a stimulant laxative.   Call us if this does not help your bowels move.   Imodium 2mg  capsule. Take 2 capsules after the 1st loose stool and then 1 capsule every 2 hours until you go a total of 12 hours without having a loose stool. Call the [facility] if loose stools continue. If diarrhea occurs @ bedtime, take 2 capsules @ bedtime. Then take 2 capsules every 4 hours until morning. Call [facility].   Diarrhea Sheet  If you  are having loose stools/diarrhea, please purchase Imodium and begin taking as outlined:  At the first sign of poorly formed or loose stools you should begin taking Imodium(loperamide) 2 mg capsules.  Take two caplets (4mg )  followed by one caplet (2mg ) every 2 hours until you have had no diarrhea for 12 hours.  During the night take two caplets (4mg ) at bedtime and continue every 4 hours during the night until the morning.  Stop taking Imodium only after there is no sign of diarrhea for 12 hours.    Always call the [facility] if you are having loose stools/diarrhea that you can't get under control.  Loose stools/diarrhea leads to dehydration (loss of water) in your body.  We have other options of trying to get the loose stools/diarrhea to stopped but you must let us know!    Constipation Sheet *Miralax in 8 oz of fluid daily.  May increase to two times a day if needed.  This is a stool softener.  If this not enough to keep your bowel regular:  You can add:  *Senokot S, start with one tablet twice a day and can increase to 4 tablets twice a day if needed.  This is a stimulant laxative.   Sometimes when you take pain medication you need BOTH a medicine to keep your stool soft and a medicine to help your bowel push it out!  Please call if the above does not work for you.   Do not go more than 2 days without a bowel movement.  It is very important that you do not become constipated.  It will make you feel sick to your stomach (nausea) and can cause abdominal pain and vomiting.     Nausea Sheet  Zofran/Ondansetron 8mg  tablet. Take 1 tablet every 8 hours as needed for nausea/vomiting. (#1 nausea med to take, this can constipate)  Compazine/Prochlorperazine 10mg  tablet. Take 1 tablet every 6 hours as needed for nausea/vomiting. (#2 nausea med to take, this can make you sleepy)  You can take these medications together or separately.  We would first like for you to try the Ondansetron by itself and then take the Prochloperizine if needed. But you are allowed to take both medications at the same time if your nausea is that severe.  If you are having persistent nausea (nausea that does not stop) please take these  medications on a staggered schedule so that the nausea medication stays in your body.  Please call the [facility] and let us know the amount of nausea that you are experiencing.  If you begin to vomit, you need to call the [facility] and if it is the weekend and you have vomited more than one time and cant get it to stop-go to the Emergency Room.  Persistent nausea/vomiting can lead to dehydration (loss of fluid in your body) and will make you feel terrible.   Ice chips, sips of clear liquids, foods that are @ room temperature, crackers, and toast tend to be better tolerated.    SYMPTOMS TO REPORT AS SOON AS POSSIBLE AFTER TREATMENT:  FEVER GREATER THAN 100.5 F  CHILLS WITH OR WITHOUT FEVER  NAUSEA AND VOMITING THAT IS NOT CONTROLLED WITH YOUR NAUSEA MEDICATION  UNUSUAL SHORTNESS OF BREATH  UNUSUAL BRUISING OR BLEEDING  TENDERNESS IN MOUTH AND THROAT WITH OR WITHOUT PRESENCE OF ULCERS  URINARY PROBLEMS  BOWEL PROBLEMS  UNUSUAL RASH     Wear comfortable clothing and clothing appropriate for easy access to any Portacath  or PICC line. Let us know if there is anything that we can do to make your therapy better!    What to do if you need assistance after hours or on the weekends: CALL [phone].  HOLD on the line, do not hang up.  You will hear multiple messages but at the end you will be connected with a nurse triage line.  They will contact the doctor if necessary.  Most of the time they will be able to assist you.  Do not call the hospital operator.       I have been informed and understand all of the instructions given to me and have received a copy. I have been instructed to call the clinic [phone] or my family physician as soon as possible for continued medical care, if indicated. I do not have any more questions at this time but understand that I may call the [facility] or the Patient Navigator at [phone] during office hours should I have questions or  need assistance in obtaining follow-up care.

## 2018-01-07 NOTE — Progress Notes (Unsigned)
Referral sent to [facility] in [place]. Records faxed to [phone] on 5/2

## 2018-01-07 NOTE — Assessment & Plan Note (Addendum)
1.  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 -Biopsy of the sternal lesion on 12/29/2017 shows metastatic hepatocellular carcinoma.  Child's class A with 6 points.  AFP of 33. - I have reviewed his new diagnosis, treatment in the palliative setting and prognosis.  He has oligo metastatic disease with one bone lesion and possibly the left adrenal lesion.  I think he will get benefit from local treatments in the form of liver directed therapy.  This can be given as transarterial chemoembolization.  I have called and talked to Dr. Vernard Gambles who kindly agreed to see this patient in the IR clinic at [facility].  I have also talked to him about starting systemic therapy with the newer agent lenvatinib at 12 mg daily.  I chose this medication because of better tolerability compared to sorafenib, which has higher incidence of hand-foot skin reaction.  We talked about the common side effects including but not limited to hypertension, fatigue, skin rashes, electrolyte and lipid abnormalities, rare chance of thromboembolism.  We plan on ordering the medicine from special pharmacy.  2.  Sternal pain: He is currently taking oxycodone 10 mg up to 4 times a day.  His pain is poorly controlled.  I think he will benefit from palliative radiation to the sternal metastatic area.  He lives in [place].  I will make a referral to radiation oncology (Dr. Rhunette Croft) in [place].  I will increase his oxycodone to 15 mg every 6 hours as needed.

## 2018-01-07 NOTE — Progress Notes (Signed)
Chemotherapy teaching pulled together. 

## 2018-01-07 NOTE — Patient Instructions (Addendum)
[facility] at [facility] Discharge Instructions  Today you saw Dr. Raliegh Ip. He is going to refer you to XRT in [place] and to IR at [facility] for liver targeted therapy. We will get you scheduled for oral chemo teaching for Lenvatinib. We will see you back for follow up and labs in 3 weeks.    Thank you for choosing [place] at [facility] to provide your oncology and hematology care.  To afford each patient quality time with our provider, please arrive at least 15 minutes before your scheduled appointment time.   If you have a lab appointment with the [employer] please come in thru the  Main Entrance and check in at the main information desk  You need to re-schedule your appointment should you arrive 10 or more minutes late.  We strive to give you quality time with our providers, and arriving late affects you and other patients whose appointments are after yours.  Also, if you no show three or more times for appointments you may be dismissed from the clinic at the providers discretion.     Again, thank you for choosing [facility].  Our hope is that these requests will decrease the amount of time that you wait before being seen by our physicians.       _____________________________________________________________  Should you have questions after your visit to [facility], please contact our office at [phone] between the hours of 8:30 a.m. and 4:30 p.m.  Voicemails left after 4:30 p.m. will not be returned until the following business day.  For prescription refill requests, have your pharmacy contact our office.       Resources For Cancer Patients and their Caregivers ? American Cancer Society: Can assist with transportation, wigs, general needs, runs Look Good Feel Better.        450-749-5853 ? Cancer Care: Provides financial assistance, online support groups, medication/co-pay assistance.   1-800-813-HOPE 516-170-8928) ? [facility] Assists [place] cancer patients and their families through emotional , educational and financial support.  (479) 429-0913 ? [place] DSS Where to apply for food stamps, Medicaid and utility assistance. (220)802-7245 ? [facility]: Transportation to medical appointments. 3301659038 ? Social Security Administration: May apply for disability if have a Stage IV cancer. 367-152-4208 (260) 017-4814 ? [employer], Disability and Transit Services: Assists with nutrition, care and transit needs. [place] Support Programs:   > Cancer Support Group  2nd Tuesday of the month 1pm-2pm, Journey Room   > Creative Journey  3rd Tuesday of the month 1130am-1pm, Journey Room

## 2018-01-07 NOTE — Addendum Note (Signed)
Addended by: Derek Jack on: 01/07/2018 04:15 PM   Modules accepted: Orders

## 2018-01-07 NOTE — Progress Notes (Signed)
[facility] FOLLOW-UP NOTE  Patient Care Team: Patient, No Pcp Per as PCP - General (General Practice)  CHIEF COMPLAINTS:  Metastatic hepatocellular carcinoma with mets to sternum (and possible left adrenal met)    HISTORY OF PRESENTING ILLNESS:  [name] 62 y.o. male is seen in consultation today for further management of newly diagnosed hepatic mass.  He presented to the emergency room on 12/04/2017 with sternal chest pain.  The pain started when he was doing exercises with bands.  He reports the pain in the center of the sternum, sharp in nature when it gets worse, and dull but the rest of the time.  The pain last few hours.  He was given oxycodone 5 mg to be taken every 6 hours.  The pain is radiating to the left inframammary area and less to the right inframammary area.  A CT scan was done in the emergency room of the chest which showed a sternal lesion with 8 cm mass in the liver.  Patient has a known history of hepatitis C which was diagnosed in 2006.  He thinks he got it from tattoos.  He also takes Plavix for the last 2 years for peripheral artery disease.  He denies any nausea or vomiting.  He denies any blood transfusions.  He bruises easily since started Plavix.  He denies any weight loss.  He has good appetite.  The sternal pain started 3 weeks ago.  He denies any bleeding per rectum or melena.    INTERVAL HISTORY:  [name] 62 y.o. male returns for routine follow-up for metastatic HCC.   Here today with family. Continues to have pain; he is taking oxycodone regularly. Reports some diarrhea; Imodium is helpful. He feels like the oxycodone is causing the diarrhea; he has not required Imodium daily.  Endorses mild weight loss, ~5 lbs per patient.   PET scan on 12/15/17 reviewed with patient/family. Underwent CT-guided biopsy of sternal lesion on 12/29/17, with pathology showing metastatic HCC.  These results reviewed in detail with patient/family today.  He  understands that his cancer is treatable, but is not curable. Goal of treatment will be to control the cancer.   Discussed potential treatment options including systemic therapy, radiation therapy, and targeted ablative therapies to the liver.   He would like to consider radiation therapy in [place], [place] because this is closer to his home.  Risks/benefits of systemic therapy discussed with patient.       MEDICAL HISTORY:  Past Medical History:  Diagnosis Date  . High cholesterol   . PAD (peripheral artery disease) ([place])     SURGICAL HISTORY: Past Surgical History:  Procedure Laterality Date  . BACK SURGERY      SOCIAL HISTORY: Social History   Socioeconomic History  . Marital status: Married    Spouse name: Not on file  . Number of children: Not on file  . Years of education: Not on file  . Highest education level: Not on file  Occupational History  . Not on file  Social Needs  . Financial resource strain: Not on file  . Food insecurity:    Worry: Not on file    Inability: Not on file  . Transportation needs:    Medical: Not on file    Non-medical: Not on file  Tobacco Use  . Smoking status: Former Smoker    Packs/day: 2.00    Years: 40.00    Pack years: 80.00    Types: Cigarettes  Last attempt to quit: 12/09/2012    Years since quitting: 5.0  . Smokeless tobacco: Never Used  Substance and Sexual Activity  . Alcohol use: Yes    Alcohol/week: 9.0 oz    Types: 15 Cans of beer per week    Comment: 6 3times a week  . Drug use: Never  . Sexual activity: Not on file  Lifestyle  . Physical activity:    Days per week: Not on file    Minutes per session: Not on file  . Stress: Not on file  Relationships  . Social connections:    Talks on phone: Not on file    Gets together: Not on file    Attends religious service: Not on file    Active member of club or organization: Not on file    Attends meetings of clubs or organizations: Not on file    Relationship  status: Not on file  . Intimate partner violence:    Fear of current or ex partner: Not on file    Emotionally abused: Not on file    Physically abused: Not on file    Forced sexual activity: Not on file  Other Topics Concern  . Not on file  Social History Narrative  . Not on file    FAMILY HISTORY: Family History  Problem Relation Age of Onset  . Alzheimer's disease Mother   . Cancer Sister        breast    ALLERGIES:  has No Known Allergies.  MEDICATIONS:  Current Outpatient Medications  Medication Sig Dispense Refill  . aspirin EC 81 MG tablet Take 81 mg by mouth daily.    Marland Kitchen atorvastatin (LIPITOR) 40 MG tablet Take 40 mg by mouth daily.    . capsaicin (ZOSTRIX) 0.025 % cream Apply 1 application topically 2 (two) times daily as needed.    . clopidogrel (PLAVIX) 75 MG tablet Take 75 mg by mouth daily.    . diclofenac sodium (VOLTAREN) 1 % GEL Apply 1 application topically 3 (three) times daily.    . Potassium 95 MG TABS Take 1 tablet by mouth daily as needed (Cramping).    . Lenvatinib 12 mg daily dose (LENVIMA) 4 (3) MG capsule Take 12 mg by mouth daily. 90 capsule 0  . oxyCODONE HCl 15 MG TABA Take 1 tablet by mouth every 6 (six) hours as needed. 90 tablet 0   No current facility-administered medications for this visit.     REVIEW OF SYSTEMS:   Constitutional: Denies fevers, chills or abnormal night sweats.  Lost few pounds of weight. Eyes: Denies blurriness of vision, double vision or watery eyes Ears, nose, mouth, throat, and face: Denies mucositis or sore throat Respiratory: Denies cough, dyspnea or wheezes Cardiovascular: Denies palpitation, lower extremity swelling.  Has midsternal pain for the last 3 weeks. Gastrointestinal:  Denies nausea, heartburn or change in bowel habits Skin: Denies abnormal skin rashes Lymphatics: Denies new lymphadenopathy or easy bruising Neurological:Denies numbness, tingling or new weaknesses Behavioral/Psych: Mood is stable, no new  changes  All other systems were reviewed with the patient and are negative. Has sternal pain on minimal movement. PHYSICAL EXAMINATION: ECOG PERFORMANCE STATUS: 1 - Symptomatic but completely ambulatory  Vitals:   01/07/18 1019  BP: (!) 134/93  Pulse: 82  Resp: 20  Temp: 97.7 F (36.5 C)  SpO2: 98%   Filed Weights   01/07/18 1019  Weight: 180 lb 9.6 oz (81.9 kg)    GENERAL:alert, no distress and comfortable SKIN:  skin color, texture, turgor are normal, no rashes or significant lesions EYES: normal, conjunctiva are pink and non-injected, sclera clear OROPHARYNX:no exudate, no erythema and lips, buccal mucosa, and tongue normal  NECK: supple, thyroid normal size, non-tender, without nodularity LYMPH:  no palpable lymphadenopathy in the cervical, axillary or inguinal LUNGS: clear to auscultation and percussion with normal breathing effort HEART: regular rate & rhythm and no murmurs and no lower extremity edema ABDOMEN: No clear palpable masses.  Abdominal examination is limited from pain from deep inspiration. Musculoskeletal:no cyanosis of digits and no clubbing  PSYCH: alert & oriented x 3 with fluent speech NEURO: no focal motor/sensory deficits  LABORATORY DATA:  I have reviewed the data as listed Lab Results  Component Value Date   WBC 4.9 12/29/2017   HGB 14.8 12/29/2017   HCT 42.1 12/29/2017   MCV 92.9 12/29/2017   PLT  12/29/2017    PLATELET CLUMPS NOTED ON SMEAR, COUNT APPEARS ADEQUATE     Chemistry      Component Value Date/Time   NA 135 12/04/2017 0921   K 4.6 12/04/2017 0921   CL 103 12/04/2017 0921   CO2 24 12/04/2017 0921   BUN 16 12/04/2017 0921   CREATININE 1.08 12/04/2017 0921      Component Value Date/Time   CALCIUM 8.7 (L) 12/04/2017 0921   ALKPHOS 79 12/04/2017 0921   AST 84 (H) 12/04/2017 0921   ALT 51 12/04/2017 0921   BILITOT 1.0 12/04/2017 0921       RADIOGRAPHIC STUDIES: I have personally reviewed the radiological images as listed  and agreed with the findings in the report. Nm Pet Image Initial (pi) Skull Base To Thigh  Result Date: 12/16/2017 CLINICAL DATA:  Initial treatment strategy for liver mass. History of hepatitis-C and cirrhosis. EXAM: NUCLEAR MEDICINE PET SKULL BASE TO THIGH TECHNIQUE: 9.9 mCi F-18 FDG was injected intravenously. Full-ring PET imaging was performed from the skull base to thigh after the radiotracer. CT data was obtained and used for attenuation correction and anatomic localization. Fasting blood glucose: 92 mg/dl COMPARISON:  None. FINDINGS: Mediastinal blood pool activity: SUV max 2.21 NECK: Symmetric hypermetabolism noted in the tonsillar regions bilaterally. This could be due to inflammation. No masses identified. No adenopathy in the neck. Incidental CT findings: none CHEST: No enlarged or hypermetabolic mediastinal or hilar lymph nodes. No worrisome pulmonary lesions on the CT scan. Calcified pleural plaques are again demonstrated. Incidental CT findings: none ABDOMEN/PELVIS: No hypermetabolism is demonstrated in the large left hepatic lobe masslike lesion at the hepatic dome. It appears to have a central scar. FNH is possible. Recommend correlation with alpha-fetoprotein level. Biopsy may be indicated. There is a questionable small nodule in the left hepatic lobe which is mildly hypermetabolic with SUV max of 4.3. The left adrenal gland lesion has an SUV max of 2.97 which is just above background mediastinal activity and this may be a lipid poor adenoma. Numerous borderline enlarged upper abdominal lymph nodes are not hypermetabolic and typical with hepatitis C and cirrhosis. Incidental CT findings: none SKELETON: The sternal lesion demonstrates mild hypermetabolism. SUV max is 4.17. I do not see any other definite osseous lesions. Incidental CT findings: none IMPRESSION: 1. The large hepatic mass is not hypermetabolic. It is well-circumscribed and there appears to be a central scar. FNH is a possibility.  Certainly with a background of severe cirrhosis this is still worrisome and may require biopsy. MR imaging with Gretta Cool is also another option. Correlation with alpha-fetoprotein level is suggested.  2. Mild hypermetabolism in the left adrenal gland lesion just above background mediastinal activity. This is indeterminate. It could be a low metabolically active metastatic focus or a lipid poor adenoma. 3. The sternal lesion is mildly hypermetabolic and concerning for metastatic focus. No other definite bone lesions are identified. Electronically Signed   By: Marijo Sanes M.D.   On: 12/16/2017 10:06   Ct Biopsy  Result Date: 12/29/2017 INDICATION: 62 year old male with primary liver tumor, concern for hepatocellular carcinoma. On PET-CT imaging he has a sternal lesion, which may represent metastatic disease. He presents for CT-guided biopsy to evaluate for possible metastatic disease. EXAM: CT BIOPSY MEDICATIONS: None. ANESTHESIA/SEDATION: Moderate (conscious) sedation was employed during this procedure. A total of Versed 2.0 mg and Fentanyl 100 mcg was administered intravenously. Moderate Sedation Time: 12 minutes. The patient's level of consciousness and vital signs were monitored continuously by radiology nursing throughout the procedure under my direct supervision. FLUOROSCOPY TIME:  CT COMPLICATIONS: None PROCEDURE: Informed written consent was obtained from the patient after a thorough discussion of the procedural risks, benefits and alternatives. All questions were addressed. Maximal Sterile Barrier Technique was utilized including caps, mask, sterile gowns, sterile gloves, sterile drape, hand hygiene and skin antiseptic. A timeout was performed prior to the initiation of the procedure. Patient positioned supine position on the CT gantry table and a scout CT of the chest was performed for planning purposes. The patient was then prepped and draped in the usual sterile fashion. The skin and subcutaneous  tissues were generously infiltrated 1% lidocaine for local anesthesia. Using CT guidance, guide was placed into the sternal bone at an oblique angle. Once we confirmed needle tip position, multiple 18 gauge core biopsy were acquired. Patient tolerated the procedure well and remained hemodynamically stable throughout. No significant blood loss.  No complications encountered. IMPRESSION: Status post CT-guided biopsy of sternal lesion. Tissue specimen sent to pathology for complete histopathologic analysis. Signed, Dulcy Fanny. Earleen Newport, DO [facility] Radiology Electronically Signed   By: Corrie Mckusick D.O.   On: 12/29/2017 09:07     PATHOLOGY:  Sternum bone biopsy: 12/29/17      ASSESSMENT & PLAN:  Metastatic hepatocellular carcinoma to bone ([place]) 1.  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 -Biopsy of the sternal lesion on 12/29/2017 shows metastatic hepatocellular carcinoma.  Child's class A with 6 points.  AFP of 33. - I have reviewed his new diagnosis, treatment in the palliative setting and prognosis.  He has oligo metastatic disease with one bone lesion and possibly the left adrenal lesion.  I think he will get benefit from local treatments in the form of liver directed therapy.  This can be given as transarterial chemoembolization.  I have called and talked to Dr. Vernard Gambles who kindly agreed to see this patient in the IR clinic at [facility].  I have also talked to him about starting systemic therapy with the newer agent lenvatinib at 12 mg daily.  I chose this medication because of better tolerability compared to sorafenib, which has higher incidence of hand-foot skin reaction.  We talked about the common side effects including but not limited to hypertension, fatigue, skin  rashes, electrolyte and lipid abnormalities, rare chance of thromboembolism.  We plan on ordering the medicine from special pharmacy.  2.  Sternal  pain: He is currently taking oxycodone 10 mg up to 4 times a day.  His pain is poorly controlled.  I think he will benefit from palliative radiation to the sternal metastatic area.  He lives in [place].  I will make a referral to radiation oncology (Dr. Rhunette Croft) in [place].  I will increase his oxycodone to 15 mg every 6 hours as needed.  Orders Placed This Encounter  Procedures  . IR Radiologist Eval & Mgmt    Standing Status:   Future    Standing Expiration Date:   03/10/2019    Order Specific Question:   Reason for Exam (SYMPTOM  OR DIAGNOSIS REQUIRED)    Answer:   [facility]    Order Specific Question:   Preferred Imaging Location?    Answer:   [facility]    All questions were answered. The patient knows to call the clinic with any problems, questions or concerns.    This note includes documentation from Mike Craze, NP, who was present during this patient's office visit and evaluation.  I have reviewed this note for its completeness and accuracy.  I have edited this note accordingly based on my findings and medical opinion.      Derek Jack, MD 01/07/2018 1:59 PM

## 2018-01-12 ENCOUNTER — Ambulatory Visit
Admission: RE | Admit: 2018-01-12 | Discharge: 2018-01-12 | Disposition: A | Discharge status: Home / Self Care | Payer: BLUE CROSS/BLUE SHIELD | Source: Ambulatory Visit | Attending: Hematology | Admitting: Hematology

## 2018-01-12 ENCOUNTER — Other Ambulatory Visit (HOSPITAL_COMMUNITY): Payer: Self-pay | Admitting: Diagnostic Radiology

## 2018-01-12 ENCOUNTER — Encounter: Payer: Self-pay | Admitting: Radiology

## 2018-01-12 DIAGNOSIS — C7951 Secondary malignant neoplasm of bone: Principal | ICD-10-CM

## 2018-01-12 DIAGNOSIS — C220 Liver cell carcinoma: Secondary | ICD-10-CM

## 2018-01-12 HISTORY — PX: IR RADIOLOGIST EVAL & MGMT: IMG5224

## 2018-01-12 NOTE — Consult Note (Signed)
Chief Complaint: Patient was seen in consultation today for  Chief Complaint  Patient presents with  . Consult    Consult for Hepatocellular Carcinoma to Bones   at the request of Katragadda,Sreedhar  Referring Physician(s): Katragadda,Sreedhar  History of Present Illness: [name] is a 61 y.o. male with recently diagnosed metastatic hepatocellular carcinoma.  Patient has history of hepatitis C and he has been aware of this diagnosis since 2006.  Patient has never had treatment for the hepatitis C.  Patient was recently working out with resistance bands when he felt pain in his sternum.  This led to imaging and identification of a sternal lesion and CT-guided biopsy.  Subsequently, the patient also had a PET CT which raises concern for a left adrenal lesion.  Patient is asymptomatic except for the pain in his sternum.  He is very active doing outdoor work and activities.  No significant change in his weight or diet.  He has mild diarrhea.  Past medical history is significant for peripheral arterial disease and he has had transcatheter interventions in his left lower extremity.  No lower extremity symptoms at this time.  Patient quit smoking 5 years ago.  Patient currently takes aspirin and Plavix for his peripheral arterial disease.  Past Medical History:  Diagnosis Date  . High cholesterol   . PAD (peripheral artery disease) ([place])     Past Surgical History:  Procedure Laterality Date  . BACK SURGERY    . IR RADIOLOGIST EVAL & MGMT  01/12/2018    Allergies: Patient has no known allergies.  Medications: Prior to Admission medications   Medication Sig Start Date End Date Taking? Authorizing Provider  aspirin EC 81 MG tablet Take 81 mg by mouth daily.   Yes [provider]  atorvastatin (LIPITOR) 40 MG tablet Take 40 mg by mouth daily.   Yes [provider]  capsaicin (ZOSTRIX) 0.025 % cream Apply 1 application topically 2 (two) times daily as needed.    Yes [provider]  clopidogrel (PLAVIX) 75 MG tablet Take 75 mg by mouth daily.   Yes [provider]  diclofenac sodium (VOLTAREN) 1 % GEL Apply 1 application topically 3 (three) times daily.   Yes [provider]  oxyCODONE (ROXICODONE) 15 MG immediate release tablet Take 1 tablet (15 mg total) by mouth every 6 (six) hours as needed for pain. 01/07/18  Yes Derek Jack, MD  Potassium 95 MG TABS Take 1 tablet by mouth daily as needed (Cramping).   Yes [provider]  Lenvatinib 12 mg daily dose (LENVIMA) 4 (3) MG capsule Take 12 mg by mouth daily. Patient not taking: Reported on 01/12/2018 01/07/18   Derek Jack, MD     Family History  Problem Relation Age of Onset  . Alzheimer's disease Mother   . Cancer Sister        breast    Social History   Socioeconomic History  . Marital status: Married    Spouse name: Not on file  . Number of children: Not on file  . Years of education: Not on file  . Highest education level: Not on file  Occupational History  . Not on file  Social Needs  . Financial resource strain: Not on file  . Food insecurity:    Worry: Not on file    Inability: Not on file  . Transportation needs:    Medical: Not on file    Non-medical: Not on file  Tobacco Use  .  Smoking status: Former Smoker    Packs/day: 2.00    Years: 40.00    Pack years: 80.00    Types: Cigarettes    Last attempt to quit: 12/09/2012    Years since quitting: 5.0  . Smokeless tobacco: Never Used  Substance and Sexual Activity  . Alcohol use: Yes    Alcohol/week: 9.0 oz    Types: 15 Cans of beer per week    Comment: 6 3times a week  . Drug use: Never  . Sexual activity: Not on file  Lifestyle  . Physical activity:    Days per week: Not on file    Minutes per session: Not on file  . Stress: Not on file  Relationships  . Social connections:    Talks on phone: Not on file    Gets together: Not on file    Attends religious service:  Not on file    Active member of club or organization: Not on file    Attends meetings of clubs or organizations: Not on file    Relationship status: Not on file  Other Topics Concern  . Not on file  Social History Narrative  . Not on file    ECOG Status: 1 - Symptomatic but completely ambulatory  Review of Systems: A 12 point ROS discussed and pertinent positives are indicated in the HPI above.  All other systems are negative.  Review of Systems  Constitutional: Negative.   Respiratory: Negative.   Cardiovascular: Negative.   Gastrointestinal: Positive for diarrhea.  Genitourinary: Negative.   Musculoskeletal:       Sternal pain.  Neurological: Negative.     Vital Signs: BP 120/75   Pulse 73   Temp 97.9 F (36.6 C) (Oral)   Resp 14   Ht 6\' 1"  (1.854 m)   Wt 180 lb (81.6 kg)   SpO2 98%   BMI 23.75 kg/m   Physical Exam  Constitutional: He appears well-developed and well-nourished. No distress.  HENT:  Mouth/Throat: Oropharynx is clear and moist.  Cardiovascular: Normal rate, regular rhythm, normal heart sounds and intact distal pulses.  Pulmonary/Chest: Effort normal and breath sounds normal.  Abdominal: Soft. Bowel sounds are normal. He exhibits no distension. There is no tenderness.  Musculoskeletal: He exhibits tenderness.  Focal tenderness in the mid sternum.  Skin:  Multiple tattoos on the chest.    Mallampati Score: 2    Imaging: Nm Pet Image Initial (pi) Skull Base To Thigh  Result Date: 12/16/2017 CLINICAL DATA:  Initial treatment strategy for liver mass. History of hepatitis-C and cirrhosis. EXAM: NUCLEAR MEDICINE PET SKULL BASE TO THIGH TECHNIQUE: 9.9 mCi F-18 FDG was injected intravenously. Full-ring PET imaging was performed from the skull base to thigh after the radiotracer. CT data was obtained and used for attenuation correction and anatomic localization. Fasting blood glucose: 92 mg/dl COMPARISON:  None. FINDINGS: Mediastinal blood pool activity:  SUV max 2.21 NECK: Symmetric hypermetabolism noted in the tonsillar regions bilaterally. This could be due to inflammation. No masses identified. No adenopathy in the neck. Incidental CT findings: none CHEST: No enlarged or hypermetabolic mediastinal or hilar lymph nodes. No worrisome pulmonary lesions on the CT scan. Calcified pleural plaques are again demonstrated. Incidental CT findings: none ABDOMEN/PELVIS: No hypermetabolism is demonstrated in the large left hepatic lobe masslike lesion at the hepatic dome. It appears to have a central scar. FNH is possible. Recommend correlation with alpha-fetoprotein level. Biopsy may be indicated. There is a questionable small nodule in the left hepatic  lobe which is mildly hypermetabolic with SUV max of 4.3. The left adrenal gland lesion has an SUV max of 2.97 which is just above background mediastinal activity and this may be a lipid poor adenoma. Numerous borderline enlarged upper abdominal lymph nodes are not hypermetabolic and typical with hepatitis C and cirrhosis. Incidental CT findings: none SKELETON: The sternal lesion demonstrates mild hypermetabolism. SUV max is 4.17. I do not see any other definite osseous lesions. Incidental CT findings: none IMPRESSION: 1. The large hepatic mass is not hypermetabolic. It is well-circumscribed and there appears to be a central scar. FNH is a possibility. Certainly with a background of severe cirrhosis this is still worrisome and may require biopsy. MR imaging with Gretta Cool is also another option. Correlation with alpha-fetoprotein level is suggested. 2. Mild hypermetabolism in the left adrenal gland lesion just above background mediastinal activity. This is indeterminate. It could be a low metabolically active metastatic focus or a lipid poor adenoma. 3. The sternal lesion is mildly hypermetabolic and concerning for metastatic focus. No other definite bone lesions are identified. Electronically Signed   By: Marijo Sanes M.D.    On: 12/16/2017 10:06   Ct Biopsy  Result Date: 12/29/2017 INDICATION: 62 year old male with primary liver tumor, concern for hepatocellular carcinoma. On PET-CT imaging he has a sternal lesion, which may represent metastatic disease. He presents for CT-guided biopsy to evaluate for possible metastatic disease. EXAM: CT BIOPSY MEDICATIONS: None. ANESTHESIA/SEDATION: Moderate (conscious) sedation was employed during this procedure. A total of Versed 2.0 mg and Fentanyl 100 mcg was administered intravenously. Moderate Sedation Time: 12 minutes. The patient's level of consciousness and vital signs were monitored continuously by radiology nursing throughout the procedure under my direct supervision. FLUOROSCOPY TIME:  CT COMPLICATIONS: None PROCEDURE: Informed written consent was obtained from the patient after a thorough discussion of the procedural risks, benefits and alternatives. All questions were addressed. Maximal Sterile Barrier Technique was utilized including caps, mask, sterile gowns, sterile gloves, sterile drape, hand hygiene and skin antiseptic. A timeout was performed prior to the initiation of the procedure. Patient positioned supine position on the CT gantry table and a scout CT of the chest was performed for planning purposes. The patient was then prepped and draped in the usual sterile fashion. The skin and subcutaneous tissues were generously infiltrated 1% lidocaine for local anesthesia. Using CT guidance, guide was placed into the sternal bone at an oblique angle. Once we confirmed needle tip position, multiple 18 gauge core biopsy were acquired. Patient tolerated the procedure well and remained hemodynamically stable throughout. No significant blood loss.  No complications encountered. IMPRESSION: Status post CT-guided biopsy of sternal lesion. Tissue specimen sent to pathology for complete histopathologic analysis. Signed, Dulcy Fanny. Earleen Newport, DO [facility]  [facility] Radiology Electronically Signed   By: Corrie Mckusick D.O.   On: 12/29/2017 09:07   Ir Radiologist Eval & Mgmt  Result Date: 01/12/2018 Please refer to notes tab for details about interventional procedure. (Op Note)   Labs:  CBC: Recent Labs    12/04/17 0921 12/29/17 0630  WBC 4.0 4.9  HGB 14.1 14.8  HCT 40.5 42.1  PLT 82* PLATELET CLUMPS NOTED ON SMEAR, COUNT APPEARS ADEQUATE    COAGS: Recent Labs    12/09/17 1100 12/29/17 0630  INR 1.11 1.10  APTT 30  --     BMP: Recent Labs    12/04/17 0921  NA 135  K 4.6  CL 103  CO2 24  GLUCOSE 117*  BUN 16  CALCIUM 8.7*  CREATININE 1.08  GFRNONAA >60  GFRAA >60    LIVER FUNCTION TESTS: Recent Labs    12/04/17 0921  BILITOT 1.0  AST 84*  ALT 51  ALKPHOS 79  PROT 7.3  ALBUMIN 2.8*    TUMOR MARKERS: No results for input(s): AFPTM, CEA, CA199, CHROMGRNA in the last 8760 hours.  Assessment and Plan:  62 year old male with hepatitis C and metastatic hepatocellular carcinoma.  Patient was referred to interventional radiology for regional liver therapy.  The liver tumor is not PET avid and lesion is best visualized on the postcontrast abdominal CT.  There is a dominant left hepatic lesion measuring close to 14 cm in size.  Difficult to exclude additional small lesions in the liver based on this imaging.  The large hepatic lesion is too large for percutaneous ablation.  The patient's best option for directed liver therapy will be transcatheter chemoembolization and/or radioembolization with Y90.  I discussed these treatment options with the patient but explained that we will need additional information with a liver MRI to better characterize the dominant liver lesion and also to exclude any additional lesions.  I explained the need for local treatment to this liver tumor is to prevent additional enlargement which could lead to spontaneous hemorrhage, pain and vascular invasion.  I also explained that he will likely  require multiple treatments due to the size of this lesion.  Patient may need a variety of transcatheter procedures including radioembolization, chemoembolization with drug-eluting beads and bland embolization.  Because he will likely require multiple treatments, it may be best to start with radioembolization because there is less of an embolic component and could use chemoembolization or bland embolization in the future.  However, I would like to get an MRI of the liver to have a better idea of the actual tumor burden before making a final decision on the treatment options.  In addition, we will need to coordinate any regional liver therapy with the patient systemic therapy and palliative radiation treatment.  Plan to get a liver MRI with and without contrast.  We will contact the patient after the MRI to discuss the findings.  Thank you for this interesting consult.  I greatly enjoyed meeting [name] and look forward to participating in their care.  A copy of this report was sent to the requesting provider on this date.  Electronically Signed: Burman Riis 01/12/2018, 9:12 AM   I spent a total of  30 Minutes   in face to face in clinical consultation, greater than 50% of which was counseling/coordinating care for hepatocellular carcinoma.

## 2018-01-13 ENCOUNTER — Ambulatory Visit (HOSPITAL_COMMUNITY)
Admission: RE | Admit: 2018-01-13 | Discharge: 2018-01-13 | Disposition: A | Discharge status: Home / Self Care | Payer: BLUE CROSS/BLUE SHIELD | Source: Ambulatory Visit | Attending: Diagnostic Radiology | Admitting: Diagnostic Radiology

## 2018-01-13 DIAGNOSIS — C7972 Secondary malignant neoplasm of left adrenal gland: Secondary | ICD-10-CM | POA: Diagnosis not present

## 2018-01-13 DIAGNOSIS — I868 Varicose veins of other specified sites: Secondary | ICD-10-CM | POA: Diagnosis not present

## 2018-01-13 DIAGNOSIS — R188 Other ascites: Secondary | ICD-10-CM | POA: Insufficient documentation

## 2018-01-13 DIAGNOSIS — C7951 Secondary malignant neoplasm of bone: Secondary | ICD-10-CM | POA: Insufficient documentation

## 2018-01-13 DIAGNOSIS — I700 Atherosclerosis of aorta: Secondary | ICD-10-CM | POA: Insufficient documentation

## 2018-01-13 DIAGNOSIS — C7971 Secondary malignant neoplasm of right adrenal gland: Secondary | ICD-10-CM | POA: Diagnosis not present

## 2018-01-13 DIAGNOSIS — C220 Liver cell carcinoma: Secondary | ICD-10-CM | POA: Diagnosis not present

## 2018-01-13 DIAGNOSIS — K7460 Unspecified cirrhosis of liver: Secondary | ICD-10-CM | POA: Insufficient documentation

## 2018-01-13 MED ORDER — GADOBENATE DIMEGLUMINE 529 MG/ML IV SOLN
15.0000 mL | Freq: Once | INTRAVENOUS | Status: AC | PRN
  Administered 2018-01-13: 15 mL via INTRAVENOUS

## 2018-01-14 ENCOUNTER — Telehealth (HOSPITAL_COMMUNITY): Payer: Self-pay | Admitting: Hematology

## 2018-01-14 ENCOUNTER — Inpatient Hospital Stay (HOSPITAL_COMMUNITY): Payer: BLUE CROSS/BLUE SHIELD

## 2018-01-14 ENCOUNTER — Other Ambulatory Visit (HOSPITAL_COMMUNITY): Payer: Self-pay | Admitting: Hematology

## 2018-01-14 MED ORDER — ONDANSETRON HCL 8 MG PO TABS: 8.0000 mg | ORAL_TABLET | Freq: Three times a day (TID) | ORAL | 2 refills | Status: AC | PRN

## 2018-01-14 MED ORDER — PROCHLORPERAZINE MALEATE 10 MG PO TABS: 10.0000 mg | ORAL_TABLET | Freq: Four times a day (QID) | ORAL | 2 refills | Status: AC | PRN

## 2018-01-14 NOTE — Telephone Encounter (Signed)
BIOLOGICS DID NOT SEND IN A PA TO [facility] AS STATED ON 5/7.  COMPLETED THE PA FORM AND FAXED

## 2018-01-14 NOTE — Progress Notes (Signed)
Chemotherapy teaching completed.  Extensive teaching packet given.  Consent signed. 

## 2018-01-18 ENCOUNTER — Other Ambulatory Visit (HOSPITAL_COMMUNITY): Payer: Self-pay | Admitting: Diagnostic Radiology

## 2018-01-18 DIAGNOSIS — C220 Liver cell carcinoma: Secondary | ICD-10-CM

## 2018-01-19 ENCOUNTER — Telehealth (HOSPITAL_COMMUNITY): Payer: Self-pay | Admitting: Emergency Medicine

## 2018-01-19 ENCOUNTER — Telehealth (HOSPITAL_COMMUNITY): Payer: Self-pay | Admitting: Hematology

## 2018-01-19 NOTE — Telephone Encounter (Signed)
Pt is suppose to receive the lenvima this evening.

## 2018-01-20 ENCOUNTER — Telehealth (HOSPITAL_COMMUNITY): Payer: Self-pay

## 2018-01-20 NOTE — Telephone Encounter (Signed)
Nurse from Dr. Moises Blood office called today to check with Dr. Delton Coombes and make sure the tests on June the 5th were ok in regards to the time and date with his treatment plan.   Per Dr. Delton Coombes they can go ahead as planned with the tests at this time.

## 2018-01-28 ENCOUNTER — Other Ambulatory Visit (HOSPITAL_COMMUNITY): Payer: Self-pay | Admitting: *Deleted

## 2018-01-28 ENCOUNTER — Telehealth: Payer: Self-pay | Admitting: Diagnostic Radiology

## 2018-01-28 DIAGNOSIS — C7951 Secondary malignant neoplasm of bone: Principal | ICD-10-CM

## 2018-01-28 DIAGNOSIS — C220 Liver cell carcinoma: Secondary | ICD-10-CM | POA: Diagnosis not present

## 2018-01-28 DIAGNOSIS — I739 Peripheral vascular disease, unspecified: Secondary | ICD-10-CM | POA: Diagnosis not present

## 2018-01-28 DIAGNOSIS — E7800 Pure hypercholesterolemia, unspecified: Secondary | ICD-10-CM | POA: Diagnosis not present

## 2018-01-28 DIAGNOSIS — Z7902 Long term (current) use of antithrombotics/antiplatelets: Secondary | ICD-10-CM | POA: Diagnosis not present

## 2018-01-28 DIAGNOSIS — B1920 Unspecified viral hepatitis C without hepatic coma: Secondary | ICD-10-CM | POA: Diagnosis not present

## 2018-01-28 DIAGNOSIS — Z7982 Long term (current) use of aspirin: Secondary | ICD-10-CM | POA: Diagnosis not present

## 2018-01-28 DIAGNOSIS — Z87891 Personal history of nicotine dependence: Secondary | ICD-10-CM | POA: Diagnosis not present

## 2018-01-28 DIAGNOSIS — Z510 Encounter for antineoplastic radiation therapy: Secondary | ICD-10-CM | POA: Diagnosis not present

## 2018-01-28 NOTE — Progress Notes (Signed)
I spoke with Mr. [name] on the telephone.  We discussed his recent MRI results.  I explained that he has more than one liver lesion and felt that we should proceed with chemoembolization with drug-eluting beads.  I have been in contact with Dr.Katragadda and we are planning to treat with a combination of chemotherapy and catheter directed chemoembolization.  In addition, the patient was just seen by radiation oncology for his sternal lesion and is planning to get treatment in the near future.  According to the patient, the radiation oncologist recommended the radiation treatment before starting the chemotherapy.  Therefore, we may need to coordinate the chemoembolization procedure after the radiation oncology and perhaps 1 to 2 weeks after the patient starts the chemotherapy.  Currently, the patient is scheduled for June 5 for the chemoembolization but this may need to be rescheduled.  Patient is scheduled to see Dr.Katragadda tomorrow and hopefully we can coordinate treatment schedules after that visit.

## 2018-01-29 ENCOUNTER — Inpatient Hospital Stay (HOSPITAL_COMMUNITY): Payer: BLUE CROSS/BLUE SHIELD

## 2018-01-29 ENCOUNTER — Inpatient Hospital Stay (HOSPITAL_BASED_OUTPATIENT_CLINIC_OR_DEPARTMENT_OTHER): Payer: BLUE CROSS/BLUE SHIELD | Admitting: Hematology

## 2018-01-29 ENCOUNTER — Encounter (HOSPITAL_COMMUNITY): Payer: Self-pay | Admitting: Hematology

## 2018-01-29 ENCOUNTER — Other Ambulatory Visit: Payer: Self-pay

## 2018-01-29 VITALS — BP 132/84 | HR 76 | Resp 16 | Wt 182.3 lb

## 2018-01-29 DIAGNOSIS — D696 Thrombocytopenia, unspecified: Secondary | ICD-10-CM | POA: Diagnosis not present

## 2018-01-29 DIAGNOSIS — C7972 Secondary malignant neoplasm of left adrenal gland: Secondary | ICD-10-CM | POA: Diagnosis not present

## 2018-01-29 DIAGNOSIS — C220 Liver cell carcinoma: Secondary | ICD-10-CM

## 2018-01-29 DIAGNOSIS — C7951 Secondary malignant neoplasm of bone: Secondary | ICD-10-CM

## 2018-01-29 DIAGNOSIS — G893 Neoplasm related pain (acute) (chronic): Secondary | ICD-10-CM | POA: Diagnosis not present

## 2018-01-29 DIAGNOSIS — R197 Diarrhea, unspecified: Secondary | ICD-10-CM | POA: Diagnosis not present

## 2018-01-29 DIAGNOSIS — C7971 Secondary malignant neoplasm of right adrenal gland: Secondary | ICD-10-CM

## 2018-01-29 DIAGNOSIS — R634 Abnormal weight loss: Secondary | ICD-10-CM | POA: Diagnosis not present

## 2018-01-29 DIAGNOSIS — Z79899 Other long term (current) drug therapy: Secondary | ICD-10-CM

## 2018-01-29 DIAGNOSIS — Z87891 Personal history of nicotine dependence: Secondary | ICD-10-CM

## 2018-01-29 DIAGNOSIS — Z7982 Long term (current) use of aspirin: Secondary | ICD-10-CM | POA: Diagnosis not present

## 2018-01-29 DIAGNOSIS — R0789 Other chest pain: Secondary | ICD-10-CM | POA: Diagnosis not present

## 2018-01-29 LAB — CBC WITH DIFFERENTIAL/PLATELET
Basophils Absolute: 0 10*3/uL (ref 0.0–0.1)
Basophils Relative: 0 %
EOS PCT: 4 %
Eosinophils Absolute: 0.1 10*3/uL (ref 0.0–0.7)
HCT: 41.5 % (ref 39.0–52.0)
Hemoglobin: 14.8 g/dL (ref 13.0–17.0)
LYMPHS ABS: 1.1 10*3/uL (ref 0.7–4.0)
LYMPHS PCT: 32 %
MCH: 32.5 pg (ref 26.0–34.0)
MCHC: 35.7 g/dL (ref 30.0–36.0)
MCV: 91 fL (ref 78.0–100.0)
MONOS PCT: 12 %
Monocytes Absolute: 0.4 10*3/uL (ref 0.1–1.0)
Neutro Abs: 1.9 10*3/uL (ref 1.7–7.7)
Neutrophils Relative %: 52 %
PLATELETS: 89 10*3/uL — AB (ref 150–400)
RBC: 4.56 MIL/uL (ref 4.22–5.81)
RDW: 13.8 % (ref 11.5–15.5)
WBC: 3.6 10*3/uL — ABNORMAL LOW (ref 4.0–10.5)

## 2018-01-29 LAB — COMPREHENSIVE METABOLIC PANEL
ALK PHOS: 99 U/L (ref 38–126)
ALT: 60 U/L (ref 17–63)
ANION GAP: 7 (ref 5–15)
AST: 103 U/L — ABNORMAL HIGH (ref 15–41)
Albumin: 2.9 g/dL — ABNORMAL LOW (ref 3.5–5.0)
BUN: 14 mg/dL (ref 6–20)
CALCIUM: 8.8 mg/dL — AB (ref 8.9–10.3)
CO2: 22 mmol/L (ref 22–32)
CREATININE: 0.89 mg/dL (ref 0.61–1.24)
Chloride: 104 mmol/L (ref 101–111)
Glucose, Bld: 163 mg/dL — ABNORMAL HIGH (ref 65–99)
Potassium: 3.7 mmol/L (ref 3.5–5.1)
SODIUM: 133 mmol/L — AB (ref 135–145)
Total Bilirubin: 2.1 mg/dL — ABNORMAL HIGH (ref 0.3–1.2)
Total Protein: 7.7 g/dL (ref 6.5–8.1)

## 2018-01-29 NOTE — Progress Notes (Signed)
[facility] FOLLOW-UP NOTE  Patient Care Team: Patient, No Pcp Per as PCP - General (General Practice)  CHIEF COMPLAINTS:  Metastatic hepatocellular carcinoma with sternal mets (and possible left adrenal met)  HISTORY OF PRESENTING ILLNESS:  [name] 62 y.o. male is seen in consultation today for further management of newly diagnosed hepatic mass.  He presented to the emergency room on 12/04/2017 with sternal chest pain.  The pain started when he was doing exercises with bands.  He reports the pain in the center of the sternum, sharp in nature when it gets worse, and dull but the rest of the time.  The pain last few hours.  He was given oxycodone 5 mg to be taken every 6 hours.  The pain is radiating to the left inframammary area and less to the right inframammary area.  A CT scan was done in the emergency room of the chest which showed a sternal lesion with 8 cm mass in the liver.  Patient has a known history of hepatitis C which was diagnosed in 2006.  He thinks he got it from tattoos.  He also takes Plavix for the last 2 years for peripheral artery disease.  He denies any nausea or vomiting.  He denies any blood transfusions.  He bruises easily since started Plavix.  He denies any weight loss.  He has good appetite.  The sternal pain started 3 weeks ago.  He denies any bleeding per rectum or melena.   INTERVAL HISTORY:  [name] 62 y.o. male returns for follow-up for metastatic HCC.   Here today with family.   Since his last visit, he saw radiation oncologist in [place] area with Dr. Rhunette Croft. Per patient, he is planning on 5 total treatments to the sternum.  He is taking about 2 oxycodone tabs per day for his pain; pain is worse with movement.  Appetite is okay. He has occasional diarrhea; he takes imodium PRN.  He has been having watery diarrhea for the past few months.  It does not happen daily; "it comes and goes."  When he has an episode, it happens 5-6 times  per day.  Denies any recent antibiotic use.  Stool is not foul smelling.    [name] has received his lenvatinib medication, but has not started it yet.  He will start chemo after he finishes radiation. Tentatively will complete radiation within the next 2 weeks or so.     MEDICAL HISTORY:  Past Medical History:  Diagnosis Date  . High cholesterol   . PAD (peripheral artery disease) ([place])     SURGICAL HISTORY: Past Surgical History:  Procedure Laterality Date  . BACK SURGERY    . IR RADIOLOGIST EVAL & MGMT  01/12/2018    SOCIAL HISTORY: Social History   Socioeconomic History  . Marital status: Married    Spouse name: Not on file  . Number of children: Not on file  . Years of education: Not on file  . Highest education level: Not on file  Occupational History  . Not on file  Social Needs  . Financial resource strain: Not on file  . Food insecurity:    Worry: Not on file    Inability: Not on file  . Transportation needs:    Medical: Not on file    Non-medical: Not on file  Tobacco Use  . Smoking status: Former Smoker    Packs/day: 2.00    Years: 40.00    Pack years: 80.00  Types: Cigarettes    Last attempt to quit: 12/09/2012    Years since quitting: 5.1  . Smokeless tobacco: Never Used  Substance and Sexual Activity  . Alcohol use: Yes    Alcohol/week: 9.0 oz    Types: 15 Cans of beer per week    Comment: 6 3times a week  . Drug use: Never  . Sexual activity: Not on file  Lifestyle  . Physical activity:    Days per week: Not on file    Minutes per session: Not on file  . Stress: Not on file  Relationships  . Social connections:    Talks on phone: Not on file    Gets together: Not on file    Attends religious service: Not on file    Active member of club or organization: Not on file    Attends meetings of clubs or organizations: Not on file    Relationship status: Not on file  . Intimate partner violence:    Fear of current or ex partner: Not on  file    Emotionally abused: Not on file    Physically abused: Not on file    Forced sexual activity: Not on file  Other Topics Concern  . Not on file  Social History Narrative  . Not on file    FAMILY HISTORY: Family History  Problem Relation Age of Onset  . Alzheimer's disease Mother   . Cancer Sister        breast    ALLERGIES:  has No Known Allergies.  MEDICATIONS:  Current Outpatient Medications  Medication Sig Dispense Refill  . aspirin EC 81 MG tablet Take 81 mg by mouth daily.    Marland Kitchen atorvastatin (LIPITOR) 40 MG tablet Take 40 mg by mouth daily.    . capsaicin (ZOSTRIX) 0.025 % cream Apply 1 application topically 2 (two) times daily as needed.    . clopidogrel (PLAVIX) 75 MG tablet Take 75 mg by mouth daily.    . diclofenac sodium (VOLTAREN) 1 % GEL Apply 1 application topically 3 (three) times daily.    . Lenvatinib 12 mg daily dose (LENVIMA) 4 (3) MG capsule Take 12 mg by mouth daily. 90 capsule 0  . ondansetron (ZOFRAN) 8 MG tablet Take 1 tablet (8 mg total) by mouth every 8 (eight) hours as needed for nausea or vomiting. 30 tablet 2  . oxyCODONE (ROXICODONE) 15 MG immediate release tablet Take 1 tablet (15 mg total) by mouth every 6 (six) hours as needed for pain. 90 tablet 0  . Potassium 95 MG TABS Take 1 tablet by mouth daily as needed (Cramping).    . prochlorperazine (COMPAZINE) 10 MG tablet Take 1 tablet (10 mg total) by mouth every 6 (six) hours as needed for nausea or vomiting. 30 tablet 2   No current facility-administered medications for this visit.     REVIEW OF SYSTEMS:   Constitutional: Denies fevers, chills or abnormal night sweats Eyes: Denies blurriness of vision, double vision or watery eyes Ears, nose, mouth, throat, and face: Denies mucositis or sore throat Respiratory: Denies cough, dyspnea or wheezes Cardiovascular: Denies palpitation, lower extremity swelling.  Has midsternal pain for the last 3 weeks. Gastrointestinal:  Denies nausea,  heartburn or change in bowel habits Skin: Denies abnormal skin rashes Lymphatics: Denies new lymphadenopathy or easy bruising Neurological:Denies numbness, tingling or new weaknesses Behavioral/Psych: Mood is stable, no new changes  All other systems were reviewed with the patient and are negative.  PHYSICAL EXAMINATION: ECOG PERFORMANCE  STATUS: 1 - Symptomatic but completely ambulatory  Vitals:   01/29/18 1018  BP: 132/84  Pulse: 76  Resp: 16  SpO2: 98%   Filed Weights   01/29/18 1018  Weight: 182 lb 4.8 oz (82.7 kg)    GENERAL:alert, no distress and comfortable SKIN: skin color, texture, turgor are normal, no rashes or significant lesions EYES: normal, conjunctiva are pink and non-injected, sclera clear   LABORATORY DATA:  I have reviewed the data as listed Lab Results  Component Value Date   WBC 3.6 (L) 01/29/2018   HGB 14.8 01/29/2018   HCT 41.5 01/29/2018   MCV 91.0 01/29/2018   PLT 89 (L) 01/29/2018     Chemistry      Component Value Date/Time   NA 133 (L) 01/29/2018 0831   K 3.7 01/29/2018 0831   CL 104 01/29/2018 0831   CO2 22 01/29/2018 0831   BUN 14 01/29/2018 0831   CREATININE 0.89 01/29/2018 0831      Component Value Date/Time   CALCIUM 8.8 (L) 01/29/2018 0831   ALKPHOS 99 01/29/2018 0831   AST 103 (H) 01/29/2018 0831   ALT 60 01/29/2018 0831   BILITOT 2.1 (H) 01/29/2018 0831       RADIOGRAPHIC STUDIES: I have personally reviewed the radiological images as listed and agreed with the findings in the report. [name]  Result Date: 01/14/2018 CLINICAL DATA:  Biopsy-proven metastatic hepatocellular carcinoma to the sternum with large left liver lobe mass on prior imaging. EXAM: MRI ABDOMEN WITHOUT AND WITH Moon TECHNIQUE: Multiplanar multisequence Charles imaging of the abdomen was performed both before and after the administration of intravenous Moon. Moon:  23m MULTIHANCE GADOBENATE DIMEGLUMINE 529 MG/ML IV SOLN COMPARISON:   12/15/2017 PET-CT.  12/04/2017 CT abdomen/pelvis. FINDINGS: Lower chest: No acute abnormality at the lung bases. Hepatobiliary: Liver surface is diffusely irregular in contour. There is relative hypertrophy of the lateral segment left liver lobe. There is patchy lace-like T2 hyperintensity in the right greater than left lower lobes, most prominent in the superior right liver lobe, compatible with confluent hepatic fibrosis. These findings are diagnostic of hepatic cirrhosis. No hepatic steatosis. There is a large 15.4 x 10.0 cm left liver lobe mass centered in segment 4A (series 5005/image 26), which partially extends into segments 2, 4B and 8, which demonstrates heterogeneous T2 hyperintensity, avid arterial hyperintensity and heterogeneous portal venous and delayed washout, considered LI-RADS 5 ([place]initely [place]. This dominant left lower lobe mass demonstrates nonocclusive tumor extension into the suprahepatic IVC (series 24/image 42) without appreciable extension into the right atrium. There is a separate 1.6 x 1.6 cm segment 7 right liver lobe mass (series 5006/image 31) with arterial hyperenhancement and portal venous central washout, with slight restricted diffusion, considered LI-RADS 4 (probably [place]. There is a 1.4 x 0.9 cm caudate lobe liver lesion with arterial hyperenhancement and no appreciable washout (series 5006/image 24), considered LI-RADS 3 ([facility] probability [facility]. No cholelithiasis. Mild diffuse gallbladder wall thickening. No biliary ductal dilatation. Common bile duct diameter 5 mm. No choledocholithiasis. Pancreas: No pancreatic mass or duct dilation.  No pancreas divisum. Spleen: Normal size. No mass. Adrenals/Urinary Tract: Avidly enhancing 3.4 x 2.6 cm left adrenal mass (series 5005/image 34) without intracellular lipid, increased from 3.0 x 2.5 cm on 12/04/2017. Avidly enhancing 1.4 x 1.4 cm right adrenal mass (series 5005/image 34) without intracellular lipid, increased from  1.2 x 1.1 cm on 12/04/2017. No hydronephrosis. Subcentimeter simple renal cysts scattered in both kidneys. No suspicious  renal masses. Stomach/Bowel: Normal non-distended stomach. Visualized small and large bowel is normal caliber, with no bowel wall thickening. Vascular/Lymphatic: Atherosclerotic nonaneurysmal abdominal aorta. Patent portal, splenic, hepatic and renal veins. Small paraumbilical and gastroesophageal varices. Left upper quadrant small perisplenic varices. No pathologically enlarged lymph nodes in the abdomen. Other: Trace perihepatic ascites.  No focal fluid collection. Musculoskeletal: Hyperenhancing known lower sternal lesion (series 24/image 85). Postsurgical changes from bilateral posterior spinal fusion in the lower lumbar spine. IMPRESSION: 1. Large 15.4 cm hepatocellular carcinoma centered in segment 4A with nonocclusive tumor extension into the suprahepatic IVC. 2. Smaller 1.6 cm segment 7 right liver lobe LI-RADS 4 (probably HCC) and 1.4 cm caudate lobe (intermediate probability for [facility]) lesions. 3. Enlarging bilateral adrenal metastases, left greater than right. 4. Known lower sternal osseous metastasis. 5. Advanced cirrhosis. Trace perihepatic ascites. Small paraumbilical, gastroesophageal and perisplenic varices. Probable noninflammatory gallbladder wall edema. 6.  Aortic Atherosclerosis (ICD10-I70.0). Electronically Signed   By: Ilona Sorrel M.D.   On: 01/14/2018 09:04   Ir Radiologist Eval & Mgmt  Result Date: 01/12/2018 Please refer to notes tab for details about interventional procedure. (Op Note)   ASSESSMENT & PLAN:  Metastatic hepatocellular carcinoma to bone ([place]) 1.  Metastatic hepatocellular carcinoma to the bones: - PET CT scan dated 12/25/2017 which showed sternal hypermetabolic lesion SUV 2.83, left adrenal mass with SUV 2.97 along with a liver mass. -Biopsy of the sternal lesion on 12/29/2017 shows metastatic hepatocellular carcinoma.  Child's class A with 6 points.   AFP of 33. -MRI of the abdomen on 01/13/2018 shows large 15.4 cm HCC in segment 4A, with nonocclusive tumor extension into suprahepatic IVC.  Smaller 1.6 cm segment 7 right lower lobe LI-RADS 4 with moderate likelihood of HCC and 1.4 cm caudate lobe lesion.  Bilateral adrenal metastasis, left more than right present. - Patient received lenvatinib 12 mg, but has not started taking it.  He was evaluated by [facility] and radiation will be given for 5 sessions starting next Thursday.  We will hold starting lenvatinib until then.  Dr. Anselm Pancoast will do chemoembolization with doxorubicin in the next few weeks.  He was told to start lenvatinib once he finishes radiation therapy to the sternum.  I will see him back in 10 days after starting lenvatinib.  We discussed about the side effects in detail.  Today his blood work showed mild thrombocytopenia with platelet count of 89.  2.  Sternal pain: He has pain 7 out of 10.  He is taking oxycodone 2 tablets daily.  He will start radiation next week.  Orders Placed This Encounter  Procedures  . CBC with Differential/Platelet    Standing Status:   Future    Standing Expiration Date:   01/29/2019  . Comprehensive metabolic panel    Standing Status:   Future    Standing Expiration Date:   01/29/2019    Order Specific Question:   Has the patient fasted?    Answer:   No    All questions were answered. The patient knows to call the clinic with any problems, questions or concerns.    This note includes documentation from Mike Craze, NP, who was present during this patient's office visit and evaluation.  I have reviewed this note for its completeness and accuracy.  I have edited this note accordingly based on my findings and medical opinion.     Charles Jack, MD 01/29/2018 5:40 PM

## 2018-01-29 NOTE — Assessment & Plan Note (Signed)
1.  Metastatic hepatocellular carcinoma to the bones: - PET CT scan dated 12/25/2017 which showed sternal hypermetabolic lesion SUV 2.13, left adrenal mass with SUV 2.97 along with a liver mass. -Biopsy of the sternal lesion on 12/29/2017 shows metastatic hepatocellular carcinoma.  Child's class A with 6 points.  AFP of 33. -MRI of the abdomen on 01/13/2018 shows large 15.4 cm HCC in segment 4A, with nonocclusive tumor extension into suprahepatic IVC.  Smaller 1.6 cm segment 7 right lower lobe LI-RADS 4 with moderate likelihood of HCC and 1.4 cm caudate lobe lesion.  Bilateral adrenal metastasis, left more than right present. - Patient received lenvatinib 12 mg, but has not started taking it.  He was evaluated by [facility] and radiation will be given for 5 sessions starting next Thursday.  We will hold starting lenvatinib until then.  Dr. Anselm Pancoast will do chemoembolization with doxorubicin in the next few weeks.  He was told to start lenvatinib once he finishes radiation therapy to the sternum.  I will see him back in 10 days after starting lenvatinib.  We discussed about the side effects in detail.  Today his blood work showed mild thrombocytopenia with platelet count of 89.  2.  Sternal pain: He has pain 7 out of 10.  He is taking oxycodone 2 tablets daily.  He will start radiation next week.

## 2018-02-02 DIAGNOSIS — C7951 Secondary malignant neoplasm of bone: Secondary | ICD-10-CM | POA: Diagnosis not present

## 2018-02-02 DIAGNOSIS — E7800 Pure hypercholesterolemia, unspecified: Secondary | ICD-10-CM | POA: Diagnosis not present

## 2018-02-02 DIAGNOSIS — I739 Peripheral vascular disease, unspecified: Secondary | ICD-10-CM | POA: Diagnosis not present

## 2018-02-02 DIAGNOSIS — Z510 Encounter for antineoplastic radiation therapy: Secondary | ICD-10-CM | POA: Diagnosis not present

## 2018-02-02 DIAGNOSIS — C220 Liver cell carcinoma: Secondary | ICD-10-CM | POA: Diagnosis not present

## 2018-02-02 DIAGNOSIS — B1920 Unspecified viral hepatitis C without hepatic coma: Secondary | ICD-10-CM | POA: Diagnosis not present

## 2018-02-03 DIAGNOSIS — Z510 Encounter for antineoplastic radiation therapy: Secondary | ICD-10-CM | POA: Diagnosis not present

## 2018-02-03 DIAGNOSIS — C7951 Secondary malignant neoplasm of bone: Secondary | ICD-10-CM | POA: Diagnosis not present

## 2018-02-03 DIAGNOSIS — B1920 Unspecified viral hepatitis C without hepatic coma: Secondary | ICD-10-CM | POA: Diagnosis not present

## 2018-02-03 DIAGNOSIS — E7800 Pure hypercholesterolemia, unspecified: Secondary | ICD-10-CM | POA: Diagnosis not present

## 2018-02-03 DIAGNOSIS — I739 Peripheral vascular disease, unspecified: Secondary | ICD-10-CM | POA: Diagnosis not present

## 2018-02-03 DIAGNOSIS — C220 Liver cell carcinoma: Secondary | ICD-10-CM | POA: Diagnosis not present

## 2018-02-04 DIAGNOSIS — C220 Liver cell carcinoma: Secondary | ICD-10-CM | POA: Diagnosis not present

## 2018-02-04 DIAGNOSIS — B1920 Unspecified viral hepatitis C without hepatic coma: Secondary | ICD-10-CM | POA: Diagnosis not present

## 2018-02-04 DIAGNOSIS — I739 Peripheral vascular disease, unspecified: Secondary | ICD-10-CM | POA: Diagnosis not present

## 2018-02-04 DIAGNOSIS — C7951 Secondary malignant neoplasm of bone: Secondary | ICD-10-CM | POA: Diagnosis not present

## 2018-02-04 DIAGNOSIS — Z510 Encounter for antineoplastic radiation therapy: Secondary | ICD-10-CM | POA: Diagnosis not present

## 2018-02-04 DIAGNOSIS — E7800 Pure hypercholesterolemia, unspecified: Secondary | ICD-10-CM | POA: Diagnosis not present

## 2018-02-05 DIAGNOSIS — I739 Peripheral vascular disease, unspecified: Secondary | ICD-10-CM | POA: Diagnosis not present

## 2018-02-05 DIAGNOSIS — E7800 Pure hypercholesterolemia, unspecified: Secondary | ICD-10-CM | POA: Diagnosis not present

## 2018-02-05 DIAGNOSIS — C220 Liver cell carcinoma: Secondary | ICD-10-CM | POA: Diagnosis not present

## 2018-02-05 DIAGNOSIS — B1920 Unspecified viral hepatitis C without hepatic coma: Secondary | ICD-10-CM | POA: Diagnosis not present

## 2018-02-05 DIAGNOSIS — Z510 Encounter for antineoplastic radiation therapy: Secondary | ICD-10-CM | POA: Diagnosis not present

## 2018-02-05 DIAGNOSIS — C7951 Secondary malignant neoplasm of bone: Secondary | ICD-10-CM | POA: Diagnosis not present

## 2018-02-08 DIAGNOSIS — G893 Neoplasm related pain (acute) (chronic): Secondary | ICD-10-CM | POA: Diagnosis not present

## 2018-02-08 DIAGNOSIS — C220 Liver cell carcinoma: Secondary | ICD-10-CM | POA: Diagnosis not present

## 2018-02-08 DIAGNOSIS — C7951 Secondary malignant neoplasm of bone: Secondary | ICD-10-CM | POA: Diagnosis not present

## 2018-02-08 DIAGNOSIS — B1920 Unspecified viral hepatitis C without hepatic coma: Secondary | ICD-10-CM | POA: Diagnosis not present

## 2018-02-08 DIAGNOSIS — Z510 Encounter for antineoplastic radiation therapy: Secondary | ICD-10-CM | POA: Diagnosis not present

## 2018-02-09 DIAGNOSIS — Z510 Encounter for antineoplastic radiation therapy: Secondary | ICD-10-CM | POA: Diagnosis not present

## 2018-02-09 DIAGNOSIS — C220 Liver cell carcinoma: Secondary | ICD-10-CM | POA: Diagnosis not present

## 2018-02-09 DIAGNOSIS — C7951 Secondary malignant neoplasm of bone: Secondary | ICD-10-CM | POA: Diagnosis not present

## 2018-02-09 DIAGNOSIS — B1920 Unspecified viral hepatitis C without hepatic coma: Secondary | ICD-10-CM | POA: Diagnosis not present

## 2018-02-09 DIAGNOSIS — G893 Neoplasm related pain (acute) (chronic): Secondary | ICD-10-CM | POA: Diagnosis not present

## 2018-02-10 ENCOUNTER — Ambulatory Visit (HOSPITAL_COMMUNITY): Payer: BLUE CROSS/BLUE SHIELD

## 2018-02-10 ENCOUNTER — Inpatient Hospital Stay (HOSPITAL_COMMUNITY)
Admission: RE | Admit: 2018-02-10 | Discharge: 2018-02-10 | Disposition: A | Discharge status: Home / Self Care | Payer: BLUE CROSS/BLUE SHIELD | Source: Ambulatory Visit | Attending: Diagnostic Radiology | Admitting: Diagnostic Radiology

## 2018-02-18 ENCOUNTER — Other Ambulatory Visit (HOSPITAL_COMMUNITY): Payer: Self-pay | Admitting: *Deleted

## 2018-02-18 ENCOUNTER — Inpatient Hospital Stay (HOSPITAL_COMMUNITY): Payer: BLUE CROSS/BLUE SHIELD | Attending: Hematology

## 2018-02-18 DIAGNOSIS — R634 Abnormal weight loss: Secondary | ICD-10-CM | POA: Insufficient documentation

## 2018-02-18 DIAGNOSIS — G893 Neoplasm related pain (acute) (chronic): Secondary | ICD-10-CM | POA: Diagnosis not present

## 2018-02-18 DIAGNOSIS — C220 Liver cell carcinoma: Secondary | ICD-10-CM

## 2018-02-18 DIAGNOSIS — R0602 Shortness of breath: Secondary | ICD-10-CM | POA: Insufficient documentation

## 2018-02-18 DIAGNOSIS — Z923 Personal history of irradiation: Secondary | ICD-10-CM | POA: Insufficient documentation

## 2018-02-18 DIAGNOSIS — C7951 Secondary malignant neoplasm of bone: Secondary | ICD-10-CM | POA: Diagnosis not present

## 2018-02-18 DIAGNOSIS — R05 Cough: Secondary | ICD-10-CM | POA: Insufficient documentation

## 2018-02-18 DIAGNOSIS — Z87891 Personal history of nicotine dependence: Secondary | ICD-10-CM | POA: Insufficient documentation

## 2018-02-18 DIAGNOSIS — C7972 Secondary malignant neoplasm of left adrenal gland: Secondary | ICD-10-CM | POA: Insufficient documentation

## 2018-02-18 DIAGNOSIS — K1370 Unspecified lesions of oral mucosa: Secondary | ICD-10-CM | POA: Diagnosis not present

## 2018-02-18 DIAGNOSIS — Z79899 Other long term (current) drug therapy: Secondary | ICD-10-CM | POA: Diagnosis not present

## 2018-02-18 DIAGNOSIS — R530 Neoplastic (malignant) related fatigue: Secondary | ICD-10-CM | POA: Diagnosis not present

## 2018-02-18 DIAGNOSIS — R531 Weakness: Secondary | ICD-10-CM | POA: Insufficient documentation

## 2018-02-18 DIAGNOSIS — Z7982 Long term (current) use of aspirin: Secondary | ICD-10-CM | POA: Insufficient documentation

## 2018-02-18 DIAGNOSIS — C7971 Secondary malignant neoplasm of right adrenal gland: Secondary | ICD-10-CM | POA: Insufficient documentation

## 2018-02-18 DIAGNOSIS — R42 Dizziness and giddiness: Secondary | ICD-10-CM | POA: Diagnosis not present

## 2018-02-18 LAB — COMPREHENSIVE METABOLIC PANEL WITH GFR
ALT: 76 U/L — ABNORMAL HIGH (ref 17–63)
AST: 156 U/L — ABNORMAL HIGH (ref 15–41)
Albumin: 3 g/dL — ABNORMAL LOW (ref 3.5–5.0)
Alkaline Phosphatase: 102 U/L (ref 38–126)
Anion gap: 9 (ref 5–15)
BUN: 15 mg/dL (ref 6–20)
CO2: 25 mmol/L (ref 22–32)
Calcium: 8.8 mg/dL — ABNORMAL LOW (ref 8.9–10.3)
Chloride: 99 mmol/L — ABNORMAL LOW (ref 101–111)
Creatinine, Ser: 1.2 mg/dL (ref 0.61–1.24)
GFR calc Af Amer: >60 mL/min
GFR calc non Af Amer: >60 mL/min
Glucose, Bld: 204 mg/dL — ABNORMAL HIGH (ref 65–99)
Potassium: 3.9 mmol/L (ref 3.5–5.1)
Sodium: 133 mmol/L — ABNORMAL LOW (ref 135–145)
Total Bilirubin: 2.6 mg/dL — ABNORMAL HIGH (ref 0.3–1.2)
Total Protein: 8.1 g/dL (ref 6.5–8.1)

## 2018-02-18 LAB — CBC WITH DIFFERENTIAL/PLATELET
BASOS ABS: 0 10*3/uL (ref 0.0–0.1)
BASOS PCT: 0 %
EOS ABS: 0.1 10*3/uL (ref 0.0–0.7)
EOS PCT: 2 %
HEMATOCRIT: 48.6 % (ref 39.0–52.0)
Hemoglobin: 16.9 g/dL (ref 13.0–17.0)
Lymphocytes Relative: 27 %
Lymphs Abs: 1.4 10*3/uL (ref 0.7–4.0)
MCH: 32.1 pg (ref 26.0–34.0)
MCHC: 34.8 g/dL (ref 30.0–36.0)
MCV: 92.4 fL (ref 78.0–100.0)
MONO ABS: 1.1 10*3/uL — AB (ref 0.1–1.0)
MONOS PCT: 22 %
Neutro Abs: 2.6 10*3/uL (ref 1.7–7.7)
Neutrophils Relative %: 49 %
PLATELETS: 73 10*3/uL — AB (ref 150–400)
RBC: 5.26 MIL/uL (ref 4.22–5.81)
RDW: 14.4 % (ref 11.5–15.5)
WBC: 5.2 10*3/uL (ref 4.0–10.5)

## 2018-02-18 MED ORDER — LENVATINIB (12 MG DAILY DOSE) 3 X 4 MG PO CPPK: 12.0000 mg | ORAL_CAPSULE | Freq: Every day | ORAL | 0 refills | Status: DC

## 2018-02-19 ENCOUNTER — Other Ambulatory Visit (HOSPITAL_COMMUNITY): Payer: Self-pay | Admitting: Diagnostic Radiology

## 2018-02-19 ENCOUNTER — Other Ambulatory Visit: Payer: Self-pay | Admitting: *Deleted

## 2018-02-19 DIAGNOSIS — C220 Liver cell carcinoma: Secondary | ICD-10-CM

## 2018-02-22 ENCOUNTER — Encounter (HOSPITAL_COMMUNITY): Payer: Self-pay | Admitting: Hematology

## 2018-02-22 ENCOUNTER — Other Ambulatory Visit: Payer: Self-pay

## 2018-02-22 ENCOUNTER — Inpatient Hospital Stay (HOSPITAL_BASED_OUTPATIENT_CLINIC_OR_DEPARTMENT_OTHER): Payer: BLUE CROSS/BLUE SHIELD | Admitting: Hematology

## 2018-02-22 DIAGNOSIS — R0602 Shortness of breath: Secondary | ICD-10-CM | POA: Diagnosis not present

## 2018-02-22 DIAGNOSIS — Z7982 Long term (current) use of aspirin: Secondary | ICD-10-CM

## 2018-02-22 DIAGNOSIS — C7972 Secondary malignant neoplasm of left adrenal gland: Secondary | ICD-10-CM | POA: Diagnosis not present

## 2018-02-22 DIAGNOSIS — Z923 Personal history of irradiation: Secondary | ICD-10-CM

## 2018-02-22 DIAGNOSIS — G893 Neoplasm related pain (acute) (chronic): Secondary | ICD-10-CM

## 2018-02-22 DIAGNOSIS — C7951 Secondary malignant neoplasm of bone: Secondary | ICD-10-CM | POA: Diagnosis not present

## 2018-02-22 DIAGNOSIS — R634 Abnormal weight loss: Secondary | ICD-10-CM | POA: Diagnosis not present

## 2018-02-22 DIAGNOSIS — R531 Weakness: Secondary | ICD-10-CM | POA: Diagnosis not present

## 2018-02-22 DIAGNOSIS — R05 Cough: Secondary | ICD-10-CM

## 2018-02-22 DIAGNOSIS — R42 Dizziness and giddiness: Secondary | ICD-10-CM | POA: Diagnosis not present

## 2018-02-22 DIAGNOSIS — R530 Neoplastic (malignant) related fatigue: Secondary | ICD-10-CM | POA: Diagnosis not present

## 2018-02-22 DIAGNOSIS — M898X9 Other specified disorders of bone, unspecified site: Secondary | ICD-10-CM

## 2018-02-22 DIAGNOSIS — C7971 Secondary malignant neoplasm of right adrenal gland: Secondary | ICD-10-CM

## 2018-02-22 DIAGNOSIS — C220 Liver cell carcinoma: Secondary | ICD-10-CM

## 2018-02-22 DIAGNOSIS — Z79899 Other long term (current) drug therapy: Secondary | ICD-10-CM

## 2018-02-22 DIAGNOSIS — Z87891 Personal history of nicotine dependence: Secondary | ICD-10-CM

## 2018-02-22 DIAGNOSIS — K1370 Unspecified lesions of oral mucosa: Secondary | ICD-10-CM | POA: Diagnosis not present

## 2018-02-23 ENCOUNTER — Ambulatory Visit (HOSPITAL_COMMUNITY): Admission: RE | Admit: 2018-02-23 | Payer: BLUE CROSS/BLUE SHIELD | Source: Ambulatory Visit

## 2018-02-23 ENCOUNTER — Encounter (HOSPITAL_COMMUNITY): Payer: Self-pay | Admitting: Hematology

## 2018-02-23 MED ORDER — OXYCODONE HCL 15 MG PO TABS: 15.0000 mg | ORAL_TABLET | Freq: Four times a day (QID) | ORAL | 0 refills | Status: DC | PRN

## 2018-02-23 NOTE — Assessment & Plan Note (Signed)
1.  Metastatic hepatocellular carcinoma to the bones: - PET CT scan dated 12/25/2017 which showed sternal hypermetabolic lesion SUV 8.18, left adrenal mass with SUV 2.97 along with a liver mass. -Biopsy of the sternal lesion on 12/29/2017 shows metastatic hepatocellular carcinoma.  Child's class A with 6 points.  AFP of 33. -MRI of the abdomen on 01/13/2018 shows large 15.4 cm HCC in segment 4A, with nonocclusive tumor extension into suprahepatic IVC.  Smaller 1.6 cm segment 7 right lower lobe LI-RADS 4 with moderate likelihood of HCC and 1.4 cm caudate lobe lesion.  Bilateral adrenal metastasis, left more than right present. - Sternal radiation therapy finished on 02/09/2018, 5 fractions. - Lenvatinib 12 mg daily started on 02/10/2018.  Patient noticed mouth sores.  Denied any nausea, vomiting, diarrhea or constipation.  He is not able to eat much and lost few pounds.  He reports feeling very weak and in bed for 5 days.  Also has occasional lightheadedness.  I have told him to hold off on lenvatinib at this time.  I will reevaluate him in 1 week.  I will start at a lower dose of 8 mg likely next week.   2.  Sternal pain: He has pain 7 out of 10. His pain has not gotten any better.  He is continuing oxycodone 1 to 2 tablets daily.

## 2018-02-23 NOTE — Progress Notes (Signed)
[address] [place], [place] [address]   CLINIC:  Medical Oncology/Hematology  PCP:  Patient, No Pcp Per No address on file None   REASON FOR VISIT:  Follow-up for metastatic hepatocellular carcinoma to the bones.  CURRENT THERAPY: Lenvatinib 12 mg daily started on 02/10/2018.   INTERVAL HISTORY:  Mr. Blankley 62 y.o. male returns for follow-up of hepatocellular carcinoma.  He finished radiation therapy to the sternum, 5 fractions on 02/09/2018.  Complains of fatigue since he started lenvatinib on 02/10/2018.  He also noticed ulcers mainly on the right side of the mouth.  He could not eat much and lost few pounds.  He also complains of occasional lightheadedness.  Severe tiredness was also reported.  Having trouble swallowing solids and liquids.  Denies any fevers or infections.   REVIEW OF SYSTEMS:  Review of Systems  Constitutional: Positive for fatigue.  HENT:   Positive for mouth sores and trouble swallowing.   Respiratory: Positive for cough and shortness of breath.   Neurological: Positive for dizziness.  Hematological: Bruises/bleeds easily.  All other systems reviewed and are negative.    PAST MEDICAL/SURGICAL HISTORY:  Past Medical History:  Diagnosis Date  . High cholesterol   . PAD (peripheral artery disease) ([place])    Past Surgical History:  Procedure Laterality Date  . BACK SURGERY    . IR RADIOLOGIST EVAL & MGMT  01/12/2018     SOCIAL HISTORY:  Social History   Socioeconomic History  . Marital status: Married    Spouse name: Not on file  . Number of children: Not on file  . Years of education: Not on file  . Highest education level: Not on file  Occupational History  . Not on file  Social Needs  . Financial resource strain: Not on file  . Food insecurity:    Worry: Not on file    Inability: Not on file  . Transportation needs:    Medical: Not on file    Non-medical: Not on file  Tobacco Use  . Smoking status: Former  Smoker    Packs/day: 2.00    Years: 40.00    Pack years: 80.00    Types: Cigarettes    Last attempt to quit: 12/09/2012    Years since quitting: 5.2  . Smokeless tobacco: Never Used  Substance and Sexual Activity  . Alcohol use: Yes    Alcohol/week: 9.0 oz    Types: 15 Cans of beer per week    Comment: 6 3times a week  . Drug use: Never  . Sexual activity: Not on file  Lifestyle  . Physical activity:    Days per week: Not on file    Minutes per session: Not on file  . Stress: Not on file  Relationships  . Social connections:    Talks on phone: Not on file    Gets together: Not on file    Attends religious service: Not on file    Active member of club or organization: Not on file    Attends meetings of clubs or organizations: Not on file    Relationship status: Not on file  . Intimate partner violence:    Fear of current or ex partner: Not on file    Emotionally abused: Not on file    Physically abused: Not on file    Forced sexual activity: Not on file  Other Topics Concern  . Not on file  Social History Narrative  . Not on  file    FAMILY HISTORY:  Family History  Problem Relation Age of Onset  . Alzheimer's disease Mother   . Cancer Sister        breast    CURRENT MEDICATIONS:  Outpatient Encounter Medications as of 02/22/2018  Medication Sig  . aspirin EC 81 MG tablet Take 81 mg by mouth daily.  Marland Kitchen atorvastatin (LIPITOR) 40 MG tablet Take 40 mg by mouth daily.  . capsaicin (ZOSTRIX) 0.025 % cream Apply 1 application topically 2 (two) times daily as needed.  . clopidogrel (PLAVIX) 75 MG tablet Take 75 mg by mouth daily.  . diclofenac sodium (VOLTAREN) 1 % GEL Apply 1 application topically 3 (three) times daily.  . Lenvatinib 12 mg daily dose (LENVIMA) 4 (3) MG capsule Take 12 mg by mouth daily.  . ondansetron (ZOFRAN) 8 MG tablet Take 1 tablet (8 mg total) by mouth every 8 (eight) hours as needed for nausea or vomiting.  Marland Kitchen oxyCODONE (ROXICODONE) 15 MG immediate  release tablet Take 1 tablet (15 mg total) by mouth every 6 (six) hours as needed for pain.  Marland Kitchen Potassium 95 MG TABS Take 1 tablet by mouth daily as needed (Cramping).  . prochlorperazine (COMPAZINE) 10 MG tablet Take 1 tablet (10 mg total) by mouth every 6 (six) hours as needed for nausea or vomiting.   No facility-administered encounter medications on file as of 02/22/2018.     ALLERGIES:  No Known Allergies   PHYSICAL EXAM:  ECOG Performance status: 1  Vitals:   02/22/18 1507  BP: (!) 134/96  Pulse: 81  Resp: 20  Temp: 98.2 F (36.8 C)  SpO2: 98%   Filed Weights   02/22/18 1507  Weight: 176 lb (79.8 kg)    Physical Exam   LABORATORY DATA:  I have reviewed the labs as listed.  CBC    Component Value Date/Time   WBC 5.2 02/18/2018 1015   RBC 5.26 02/18/2018 1015   HGB 16.9 02/18/2018 1015   HCT 48.6 02/18/2018 1015   PLT 73 (L) 02/18/2018 1015   MCV 92.4 02/18/2018 1015   MCH 32.1 02/18/2018 1015   MCHC 34.8 02/18/2018 1015   RDW 14.4 02/18/2018 1015   LYMPHSABS 1.4 02/18/2018 1015   MONOABS 1.1 (H) 02/18/2018 1015   EOSABS 0.1 02/18/2018 1015   BASOSABS 0.0 02/18/2018 1015   CMP Latest Ref Rng & Units 02/18/2018 01/29/2018 12/04/2017  Glucose 65 - 99 mg/dL 204(H) 163(H) 117(H)  BUN 6 - 20 mg/dL 15 14 16   Creatinine 0.61 - 1.24 mg/dL 1.20 0.89 1.08  Sodium 135 - 145 mmol/L 133(L) 133(L) 135  Potassium 3.5 - 5.1 mmol/L 3.9 3.7 4.6  Chloride 101 - 111 mmol/L 99(L) 104 103  CO2 22 - 32 mmol/L 25 22 24   Calcium 8.9 - 10.3 mg/dL 8.8(L) 8.8(L) 8.7(L)  Total Protein 6.5 - 8.1 g/dL 8.1 7.7 7.3  Total Bilirubin 0.3 - 1.2 mg/dL 2.6(H) 2.1(H) 1.0  Alkaline Phos 38 - 126 U/L 102 99 79  AST 15 - 41 U/L 156(H) 103(H) 84(H)  ALT 17 - 63 U/L 76(H) 60 51            ASSESSMENT & PLAN:   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  Metastatic hepatocellular carcinoma to the bones: - PET CT scan dated 12/25/2017 which showed sternal hypermetabolic lesion  SUV 7.16, left adrenal mass with SUV 2.97 along with a liver mass. -Biopsy of the sternal lesion on 12/29/2017 shows metastatic hepatocellular carcinoma.  Child's class A with 6 points.  AFP of 33. -MRI of the abdomen on 01/13/2018 shows large 15.4 cm HCC in segment 4A, with nonocclusive tumor extension into suprahepatic IVC.  Smaller 1.6 cm segment 7 right lower lobe LI-RADS 4 with moderate likelihood of HCC and 1.4 cm caudate lobe lesion.  Bilateral adrenal metastasis, left more than right present. - Sternal radiation therapy finished on 02/09/2018, 5 fractions. - Lenvatinib 12 mg daily started on 02/10/2018.  Patient noticed mouth sores.  Denied any nausea, vomiting, diarrhea or constipation.  He is not able to eat much and lost few pounds.  He reports feeling very weak and in bed for 5 days.  Also has occasional lightheadedness.  I have told him to hold off on lenvatinib at this time.  I will reevaluate him in 1 week.  I will start at a lower dose of 8 mg likely next week.   2.  Sternal pain: He has pain 7 out of 10. His pain has not gotten any better.  He is continuing oxycodone 1 to 2 tablets daily.       Orders placed this encounter:  No orders of the defined types were placed in this encounter.     Derek Jack, MD [place] [phone]

## 2018-02-24 ENCOUNTER — Ambulatory Visit (HOSPITAL_COMMUNITY): Payer: BLUE CROSS/BLUE SHIELD

## 2018-02-24 ENCOUNTER — Inpatient Hospital Stay (HOSPITAL_COMMUNITY): Payer: BLUE CROSS/BLUE SHIELD

## 2018-02-26 ENCOUNTER — Telehealth (HOSPITAL_COMMUNITY): Payer: Self-pay | Admitting: Oncology

## 2018-02-26 ENCOUNTER — Telehealth: Payer: Self-pay | Admitting: Diagnostic Radiology

## 2018-02-26 DIAGNOSIS — C220 Liver cell carcinoma: Secondary | ICD-10-CM

## 2018-02-26 DIAGNOSIS — C7951 Secondary malignant neoplasm of bone: Principal | ICD-10-CM

## 2018-02-26 NOTE — Progress Notes (Signed)
Patient is scheduled for chemoembolization of his large liver lesion on 03/03/18.  I contacted patient to see how he was feeling because I saw that his T.bilirubin is now elevated at 2.6 and had to stop Lenvatinib.  Patient says he is feeling "terrible".  He has diffuse thoracic pain.  He is not eating and losing weight.  Patient is concerned that he will not be able to tolerate the chemoembolization procedure and I agree.  We will cancel the procedure for now.  I contacted Robynn Pane at [facility] to discuss the patient.  He agreed that the chemoembolization needs to be postponed and we discussed re-staging his disease with imaging to see if things have significantly changed and possibly figure out why the T.Bilirubin is now elevated.  Oncology will arrange follow-up.

## 2018-02-26 NOTE — Telephone Encounter (Signed)
Dr. Anselm Pancoast, interventional radiologist, call the clinic today to discuss this patient with me.  He reports that he made contact with the patient and the patient reports he is doing poorly.  Of note, his total bilirubin has increased from 2.1-2.6.  It was normal in March 2019.  Given the patient's change in liver function, and his overall well-being, Dr. Anselm Pancoast does not think that chemoembolization next week will be feasible.  As result he is recommending cancel initiation of this procedure and I agree.  We will work on getting the patient in the next week for repeat laboratory work and office visit to consider reimaging/restaging of [place].  Based upon the patient's performance status and imaging studies, hospice may need to be broached/considered.   Robynn Pane, PA-C 02/26/2018 11:31 AM

## 2018-03-02 ENCOUNTER — Other Ambulatory Visit (HOSPITAL_COMMUNITY): Payer: BLUE CROSS/BLUE SHIELD

## 2018-03-02 ENCOUNTER — Telehealth (HOSPITAL_COMMUNITY): Payer: Self-pay | Admitting: *Deleted

## 2018-03-02 ENCOUNTER — Ambulatory Visit (HOSPITAL_COMMUNITY): Payer: BLUE CROSS/BLUE SHIELD | Admitting: Hematology

## 2018-03-03 ENCOUNTER — Ambulatory Visit (HOSPITAL_COMMUNITY): Payer: BLUE CROSS/BLUE SHIELD

## 2018-03-03 ENCOUNTER — Encounter (HOSPITAL_COMMUNITY): Payer: Self-pay

## 2018-03-03 ENCOUNTER — Inpatient Hospital Stay (HOSPITAL_COMMUNITY)
Admission: RE | Admit: 2018-03-03 | Discharge: 2018-03-03 | Disposition: A | Discharge status: Home / Self Care | Payer: BLUE CROSS/BLUE SHIELD | Source: Ambulatory Visit | Attending: Diagnostic Radiology | Admitting: Diagnostic Radiology

## 2018-03-04 ENCOUNTER — Inpatient Hospital Stay (HOSPITAL_COMMUNITY): Payer: BLUE CROSS/BLUE SHIELD

## 2018-03-04 ENCOUNTER — Encounter (HOSPITAL_COMMUNITY): Payer: Self-pay | Admitting: Hematology

## 2018-03-04 ENCOUNTER — Inpatient Hospital Stay (HOSPITAL_BASED_OUTPATIENT_CLINIC_OR_DEPARTMENT_OTHER): Payer: BLUE CROSS/BLUE SHIELD | Admitting: Hematology

## 2018-03-04 VITALS — BP 121/79 | HR 80 | Temp 98.1°F | Resp 16 | Wt 176.1 lb

## 2018-03-04 DIAGNOSIS — C220 Liver cell carcinoma: Secondary | ICD-10-CM

## 2018-03-04 DIAGNOSIS — C7971 Secondary malignant neoplasm of right adrenal gland: Secondary | ICD-10-CM

## 2018-03-04 DIAGNOSIS — C7972 Secondary malignant neoplasm of left adrenal gland: Secondary | ICD-10-CM | POA: Diagnosis not present

## 2018-03-04 DIAGNOSIS — C7951 Secondary malignant neoplasm of bone: Secondary | ICD-10-CM | POA: Diagnosis not present

## 2018-03-04 DIAGNOSIS — R530 Neoplastic (malignant) related fatigue: Secondary | ICD-10-CM

## 2018-03-04 DIAGNOSIS — G893 Neoplasm related pain (acute) (chronic): Secondary | ICD-10-CM | POA: Diagnosis not present

## 2018-03-04 LAB — COMPREHENSIVE METABOLIC PANEL
ALT: 89 U/L — ABNORMAL HIGH (ref 0–44)
ANION GAP: 8 (ref 5–15)
AST: 146 U/L — ABNORMAL HIGH (ref 15–41)
Albumin: 2.4 g/dL — ABNORMAL LOW (ref 3.5–5.0)
Alkaline Phosphatase: 91 U/L (ref 38–126)
BUN: 13 mg/dL (ref 8–23)
CALCIUM: 8.2 mg/dL — AB (ref 8.9–10.3)
CO2: 22 mmol/L (ref 22–32)
Chloride: 103 mmol/L (ref 98–111)
Creatinine, Ser: 1.06 mg/dL (ref 0.61–1.24)
GFR calc non Af Amer: >60 mL/min (ref >60)
Glucose, Bld: 199 mg/dL — ABNORMAL HIGH (ref 70–99)
Potassium: 3.6 mmol/L (ref 3.5–5.1)
SODIUM: 133 mmol/L — AB (ref 135–145)
Total Bilirubin: 1.8 mg/dL — ABNORMAL HIGH (ref 0.3–1.2)
Total Protein: 7.6 g/dL (ref 6.5–8.1)

## 2018-03-04 LAB — CBC WITH DIFFERENTIAL/PLATELET
Basophils Absolute: 0 10*3/uL (ref 0.0–0.1)
Basophils Relative: 0 %
EOS ABS: 0.2 10*3/uL (ref 0.0–0.7)
EOS PCT: 3 %
HCT: 43.2 % (ref 39.0–52.0)
Hemoglobin: 14.9 g/dL (ref 13.0–17.0)
Lymphocytes Relative: 22 %
Lymphs Abs: 1.2 10*3/uL (ref 0.7–4.0)
MCH: 33 pg (ref 26.0–34.0)
MCHC: 34.5 g/dL (ref 30.0–36.0)
MCV: 95.6 fL (ref 78.0–100.0)
MONO ABS: 0.7 10*3/uL (ref 0.1–1.0)
MONOS PCT: 14 %
Neutro Abs: 3.3 10*3/uL (ref 1.7–7.7)
Neutrophils Relative %: 61 %
PLATELETS: 157 10*3/uL (ref 150–400)
RBC: 4.52 MIL/uL (ref 4.22–5.81)
RDW: 15 % (ref 11.5–15.5)
WBC: 5.4 10*3/uL (ref 4.0–10.5)

## 2018-03-04 NOTE — Patient Instructions (Signed)
[facility] at [facility] Discharge Instructions  You saw Dr. Katragadda today.   Thank you for choosing [facility] at [facility] to provide your oncology and hematology care.  To afford each patient quality time with our provider, please arrive at least 15 minutes before your scheduled appointment time.   If you have a lab appointment with the Cancer Center please come in thru the  Main Entrance and check in at the main information desk  You need to re-schedule your appointment should you arrive 10 or more minutes late.  We strive to give you quality time with our providers, and arriving late affects you and other patients whose appointments are after yours.  Also, if you no show three or more times for appointments you may be dismissed from the clinic at the providers discretion.     Again, thank you for choosing [facility].  Our hope is that these requests will decrease the amount of time that you wait before being seen by our physicians.       _____________________________________________________________  Should you have questions after your visit to [facility], please contact our office at [phone] between the hours of 8:30 a.m. and 4:30 p.m.  Voicemails left after 4:30 p.m. will not be returned until the following business day.  For prescription refill requests, have your pharmacy contact our office.       Resources For Cancer Patients and their Caregivers ? American Cancer Society: Can assist with transportation, wigs, general needs, runs Look Good Feel Better.        1-888-227-6333 ? Cancer Care: Provides financial assistance, online support groups, medication/co-pay assistance.  1-800-813-HOPE (4673) ? [facility] Assists [place] cancer patients and their families through emotional , educational and financial support.  336-427-4357 ? [place] DSS Where to apply for  food stamps, Medicaid and utility assistance. 336-342-1394 ? RCATS: Transportation to medical appointments. 336-347-2287 ? Social Security Administration: May apply for disability if have a Stage IV cancer. 336-342-7796 1-800-772-1213 ? [place] Aging, Disability and Transit Services: Assists with nutrition, care and transit needs. 336-349-2343  Cancer Center Support Programs:   > Cancer Support Group  2nd Tuesday of the month 1pm-2pm, Journey Room   > Creative Journey  3rd Tuesday of the month 1130am-1pm, Journey Room     

## 2018-03-08 NOTE — Progress Notes (Signed)
Charles Charles, [place] [id number]   CLINIC:  Medical Oncology/Hematology  PCP:  Patient, No Pcp Per No address on file None   REASON FOR VISIT:  Follow-up for metastatic hepatocellular carcinoma.  CURRENT THERAPY: Lenvatinib.  BRIEF ONCOLOGIC HISTORY:   No history exists.     CANCER STAGING: Cancer Staging No matching staging information was found for the patient.   INTERVAL HISTORY:  Charles Charles 62 y.o. male returns for routine follow-up of hepatocellular carcinoma.  Since we held his lenvatinib, he started feeling better.  He has some vague soreness in the abdominal region but denies any sternal pain.  Weakness has also improved.  He lost a total weight of 6 pounds from 01/29/2018.  Sternal pain is only rated as 1/10.  He gained 2 pounds since last visit.    REVIEW OF SYSTEMS:  Review of Systems  Constitutional: Positive for fatigue.  Hematological: Bruises/bleeds easily.  All other systems reviewed and are negative.    PAST MEDICAL/SURGICAL HISTORY:  Past Medical History:  Diagnosis Date  . High cholesterol   . PAD (peripheral artery disease) ([place])    Past Surgical History:  Procedure Laterality Date  . BACK SURGERY    . IR RADIOLOGIST EVAL & MGMT  01/12/2018     SOCIAL HISTORY:  Social History   Socioeconomic History  . Marital status: Married    Spouse name: Not on file  . Number of children: Not on file  . Years of education: Not on file  . Highest education level: Not on file  Occupational History  . Not on file  Social Needs  . Financial resource strain: Not on file  . Food insecurity:    Worry: Not on file    Inability: Not on file  . Transportation needs:    Medical: Not on file    Non-medical: Not on file  Tobacco Use  . Smoking status: Former Smoker    Packs/day: 2.00    Years: 40.00    Pack years: 80.00    Types: Cigarettes    Last attempt to quit: 12/09/2012    Years since quitting: 5.2  .  Smokeless tobacco: Never Used  Substance and Sexual Activity  . Alcohol use: Yes    Alcohol/week: 9.0 oz    Types: 15 Cans of beer per week    Comment: 6 3times a week  . Drug use: Never  . Sexual activity: Not on file  Lifestyle  . Physical activity:    Days per week: Not on file    Minutes per session: Not on file  . Stress: Not on file  Relationships  . Social connections:    Talks on phone: Not on file    Gets together: Not on file    Attends religious service: Not on file    Active member of club or organization: Not on file    Attends meetings of clubs or organizations: Not on file    Relationship status: Not on file  . Intimate partner violence:    Fear of current or ex partner: Not on file    Emotionally abused: Not on file    Physically abused: Not on file    Forced sexual activity: Not on file  Other Topics Concern  . Not on file  Social History Narrative  . Not on file    FAMILY HISTORY:  Family History  Problem Relation Age of Onset  . Alzheimer's disease Mother   .  Cancer Sister        breast    CURRENT MEDICATIONS:  Outpatient Encounter Medications as of 03/04/2018  Medication Sig  . aspirin EC 81 MG tablet Take 81 mg by mouth daily.  Marland Kitchen atorvastatin (LIPITOR) 40 MG tablet Take 40 mg by mouth daily.  . capsaicin (ZOSTRIX) 0.025 % cream Apply 1 application topically 2 (two) times daily as needed.  . clopidogrel (PLAVIX) 75 MG tablet Take 75 mg by mouth daily.  . diclofenac sodium (VOLTAREN) 1 % GEL Apply 1 application topically 3 (three) times daily.  . Lenvatinib 12 mg daily dose (LENVIMA) 4 (3) MG capsule Take 12 mg by mouth daily.  . ondansetron (ZOFRAN) 8 MG tablet Take 1 tablet (8 mg total) by mouth every 8 (eight) hours as needed for nausea or vomiting.  Marland Kitchen oxyCODONE (ROXICODONE) 15 MG immediate release tablet Take 1 tablet (15 mg total) by mouth every 6 (six) hours as needed for pain.  Marland Kitchen Potassium 95 MG TABS Take 1 tablet by mouth daily as needed  (Cramping).  . prochlorperazine (COMPAZINE) 10 MG tablet Take 1 tablet (10 mg total) by mouth every 6 (six) hours as needed for nausea or vomiting.   No facility-administered encounter medications on file as of 03/04/2018.     ALLERGIES:  No Known Allergies   PHYSICAL EXAM:  ECOG Performance status: 1  Vitals:   03/04/18 0846  BP: 121/79  Pulse: 80  Resp: 16  Temp: 98.1 F (36.7 C)  SpO2: 99%   Filed Weights   03/04/18 0846  Weight: 176 lb 1.6 oz (79.9 kg)    Physical Exam   LABORATORY DATA:  I have reviewed the labs as listed.  CBC    Component Value Date/Time   WBC 5.4 03/04/2018 0801   RBC 4.52 03/04/2018 0801   HGB 14.9 03/04/2018 0801   HCT 43.2 03/04/2018 0801   PLT 157 03/04/2018 0801   MCV 95.6 03/04/2018 0801   MCH 33.0 03/04/2018 0801   MCHC 34.5 03/04/2018 0801   RDW 15.0 03/04/2018 0801   LYMPHSABS 1.2 03/04/2018 0801   MONOABS 0.7 03/04/2018 0801   EOSABS 0.2 03/04/2018 0801   BASOSABS 0.0 03/04/2018 0801   CMP Latest Ref Rng & Units 03/04/2018 02/18/2018 01/29/2018  Glucose 70 - 99 mg/dL 199(H) 204(H) 163(H)  BUN 8 - 23 mg/dL 13 15 14   Creatinine 0.61 - 1.24 mg/dL 1.06 1.20 0.89  Sodium 135 - 145 mmol/L 133(L) 133(L) 133(L)  Potassium 3.5 - 5.1 mmol/L 3.6 3.9 3.7  Chloride 98 - 111 mmol/L 103 99(L) 104  CO2 22 - 32 mmol/L 22 25 22   Calcium 8.9 - 10.3 mg/dL 8.2(L) 8.8(L) 8.8(L)  Total Protein 6.5 - 8.1 g/dL 7.6 8.1 7.7  Total Bilirubin 0.3 - 1.2 mg/dL 1.8(H) 2.6(H) 2.1(H)  Alkaline Phos 38 - 126 U/L 91 102 99  AST 15 - 41 U/L 146(H) 156(H) 103(H)  ALT 0 - 44 U/L 89(H) 76(H) 60         ASSESSMENT & PLAN:   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  Metastatic hepatocellular carcinoma to the bones: - PET CT scan dated 12/25/2017 which showed sternal hypermetabolic lesion SUV 4.09, left adrenal mass with SUV 2.97 along with a liver mass. -Biopsy of the sternal lesion on 12/29/2017 shows metastatic hepatocellular carcinoma.   Child's class A with 6 points.  AFP of 33. -MRI of the abdomen on 01/13/2018 shows large 15.4 cm HCC in segment 4A, with nonocclusive tumor  extension into suprahepatic IVC.  Smaller 1.6 cm segment 7 right lower lobe LI-RADS 4 with moderate likelihood of HCC and 1.4 cm caudate lobe lesion.  Bilateral adrenal metastasis, left more than right present. - Sternal radiation therapy finished on 02/09/2018, 5 fractions. - Lenvatinib 12 mg daily started on 02/10/2018.  Patient noticed mouth sores.  Denied any nausea, vomiting, diarrhea or constipation.  He is not able to eat much and lost few pounds.  He reports feeling very weak and in bed for 5 days.  Also has occasional lightheadedness.  Lenvatinib was held on 02/23/2018.  Today he feels much better in terms of energy.  No mouth sores.  Appetite is returning back to normal.  We will start him at 4 mg daily for a week and then titrated up to 8 mg daily.  We will see him back in 2 weeks for follow-up.  His bilirubin has improved to 1.8 today.  Lenvatinib can cause elevated bilirubin.  2.  Sternal pain: His sternal pain has improved.  He has mild soreness in the abdominal region.  He takes oxycodone as needed.       Orders placed this encounter:  Orders Placed This Encounter  Procedures  . CBC with Differential/Platelet  . Comprehensive metabolic panel      Derek Jack, MD [place] [phone]

## 2018-03-08 NOTE — Assessment & Plan Note (Signed)
1.  Metastatic hepatocellular carcinoma to the bones: - PET CT scan dated 12/25/2017 which showed sternal hypermetabolic lesion SUV 5.09, left adrenal mass with SUV 2.97 along with a liver mass. -Biopsy of the sternal lesion on 12/29/2017 shows metastatic hepatocellular carcinoma.  Child's class A with 6 points.  AFP of 33. -MRI of the abdomen on 01/13/2018 shows large 15.4 cm HCC in segment 4A, with nonocclusive tumor extension into suprahepatic IVC.  Smaller 1.6 cm segment 7 right lower lobe LI-RADS 4 with moderate likelihood of HCC and 1.4 cm caudate lobe lesion.  Bilateral adrenal metastasis, left more than right present. - Sternal radiation therapy finished on 02/09/2018, 5 fractions. - Lenvatinib 12 mg daily started on 02/10/2018.  Patient noticed mouth sores.  Denied any nausea, vomiting, diarrhea or constipation.  He is not able to eat much and lost few pounds.  He reports feeling very weak and in bed for 5 days.  Also has occasional lightheadedness.  Lenvatinib was held on 02/23/2018.  Today he feels much better in terms of energy.  No mouth sores.  Appetite is returning back to normal.  We will start him at 4 mg daily for a week and then titrated up to 8 mg daily.  We will see him back in 2 weeks for follow-up.  His bilirubin has improved to 1.8 today.  Lenvatinib can cause elevated bilirubin.  2.  Sternal pain: His sternal pain has improved.  He has mild soreness in the abdominal region.  He takes oxycodone as needed.

## 2018-03-12 ENCOUNTER — Other Ambulatory Visit (HOSPITAL_COMMUNITY): Payer: Self-pay | Admitting: *Deleted

## 2018-03-12 DIAGNOSIS — C7951 Secondary malignant neoplasm of bone: Principal | ICD-10-CM

## 2018-03-12 DIAGNOSIS — C220 Liver cell carcinoma: Secondary | ICD-10-CM

## 2018-03-15 MED ORDER — LENVATINIB (8 MG DAILY DOSE) 2 X 4 MG PO CPPK: 8.0000 mg | ORAL_CAPSULE | Freq: Every day | ORAL | 0 refills | Status: DC

## 2018-03-15 NOTE — Telephone Encounter (Signed)
Based upon last office note, patient is to get Lenvatinib 8 mg.  Okay to refill per Dr. Delton Coombes.

## 2018-03-18 ENCOUNTER — Other Ambulatory Visit: Payer: Self-pay

## 2018-03-18 ENCOUNTER — Inpatient Hospital Stay (HOSPITAL_COMMUNITY): Payer: BLUE CROSS/BLUE SHIELD

## 2018-03-18 ENCOUNTER — Encounter (HOSPITAL_COMMUNITY): Payer: Self-pay | Admitting: Hematology

## 2018-03-18 ENCOUNTER — Inpatient Hospital Stay (HOSPITAL_COMMUNITY): Payer: BLUE CROSS/BLUE SHIELD | Attending: Hematology | Admitting: Hematology

## 2018-03-18 VITALS — BP 125/84 | HR 76 | Temp 97.7°F | Resp 16 | Wt 171.1 lb

## 2018-03-18 DIAGNOSIS — C7971 Secondary malignant neoplasm of right adrenal gland: Secondary | ICD-10-CM | POA: Diagnosis not present

## 2018-03-18 DIAGNOSIS — Z87891 Personal history of nicotine dependence: Secondary | ICD-10-CM | POA: Diagnosis not present

## 2018-03-18 DIAGNOSIS — Z7982 Long term (current) use of aspirin: Secondary | ICD-10-CM | POA: Diagnosis not present

## 2018-03-18 DIAGNOSIS — R1011 Right upper quadrant pain: Secondary | ICD-10-CM | POA: Insufficient documentation

## 2018-03-18 DIAGNOSIS — R490 Dysphonia: Secondary | ICD-10-CM

## 2018-03-18 DIAGNOSIS — C7951 Secondary malignant neoplasm of bone: Principal | ICD-10-CM

## 2018-03-18 DIAGNOSIS — C7972 Secondary malignant neoplasm of left adrenal gland: Secondary | ICD-10-CM

## 2018-03-18 DIAGNOSIS — R530 Neoplastic (malignant) related fatigue: Secondary | ICD-10-CM | POA: Diagnosis not present

## 2018-03-18 DIAGNOSIS — Z923 Personal history of irradiation: Secondary | ICD-10-CM | POA: Diagnosis not present

## 2018-03-18 DIAGNOSIS — C220 Liver cell carcinoma: Secondary | ICD-10-CM

## 2018-03-18 DIAGNOSIS — G893 Neoplasm related pain (acute) (chronic): Secondary | ICD-10-CM

## 2018-03-18 DIAGNOSIS — R197 Diarrhea, unspecified: Secondary | ICD-10-CM | POA: Diagnosis not present

## 2018-03-18 DIAGNOSIS — Z79899 Other long term (current) drug therapy: Secondary | ICD-10-CM | POA: Diagnosis not present

## 2018-03-18 LAB — CBC WITH DIFFERENTIAL/PLATELET
Basophils Absolute: 0 10*3/uL (ref 0.0–0.1)
Basophils Relative: 0 %
EOS ABS: 0.2 10*3/uL (ref 0.0–0.7)
Eosinophils Relative: 3 %
HCT: 48.4 % (ref 39.0–52.0)
Hemoglobin: 16.8 g/dL (ref 13.0–17.0)
LYMPHS ABS: 1.5 10*3/uL (ref 0.7–4.0)
Lymphocytes Relative: 33 %
MCH: 32.8 pg (ref 26.0–34.0)
MCHC: 34.7 g/dL (ref 30.0–36.0)
MCV: 94.5 fL (ref 78.0–100.0)
MONOS PCT: 13 %
Monocytes Absolute: 0.6 10*3/uL (ref 0.1–1.0)
Neutro Abs: 2.4 10*3/uL (ref 1.7–7.7)
Neutrophils Relative %: 51 %
Platelets: 95 10*3/uL — ABNORMAL LOW (ref 150–400)
RBC: 5.12 MIL/uL (ref 4.22–5.81)
RDW: 14.6 % (ref 11.5–15.5)
WBC: 4.6 10*3/uL (ref 4.0–10.5)

## 2018-03-18 LAB — COMPREHENSIVE METABOLIC PANEL
ALT: 76 U/L — ABNORMAL HIGH (ref 0–44)
ANION GAP: 6 (ref 5–15)
AST: 124 U/L — ABNORMAL HIGH (ref 15–41)
Albumin: 2.7 g/dL — ABNORMAL LOW (ref 3.5–5.0)
Alkaline Phosphatase: 102 U/L (ref 38–126)
BUN: 11 mg/dL (ref 8–23)
CHLORIDE: 105 mmol/L (ref 98–111)
CO2: 23 mmol/L (ref 22–32)
Calcium: 8.6 mg/dL — ABNORMAL LOW (ref 8.9–10.3)
Creatinine, Ser: 0.94 mg/dL (ref 0.61–1.24)
GFR calc non Af Amer: >60 mL/min (ref >60)
Glucose, Bld: 166 mg/dL — ABNORMAL HIGH (ref 70–99)
POTASSIUM: 4.1 mmol/L (ref 3.5–5.1)
SODIUM: 134 mmol/L — AB (ref 135–145)
Total Bilirubin: 1.4 mg/dL — ABNORMAL HIGH (ref 0.3–1.2)
Total Protein: 8.6 g/dL — ABNORMAL HIGH (ref 6.5–8.1)

## 2018-03-18 NOTE — Assessment & Plan Note (Signed)
1.  Metastatic hepatocellular carcinoma to the bones: - PET CT scan dated 12/25/2017 which showed sternal hypermetabolic lesion SUV 8.14, left adrenal mass with SUV 2.97 along with a liver mass. -Biopsy of the sternal lesion on 12/29/2017 shows metastatic hepatocellular carcinoma.  Child's class A with 6 points.  AFP of 33. -MRI of the abdomen on 01/13/2018 shows large 15.4 cm HCC in segment 4A, with nonocclusive tumor extension into suprahepatic IVC.  Smaller 1.6 cm segment 7 right lower lobe LI-RADS 4 with moderate likelihood of HCC and 1.4 cm caudate lobe lesion.  Bilateral adrenal metastasis, left more than right present. - Sternal radiation therapy finished on 02/09/2018, 5 fractions. - Lenvatinib 12 mg daily started on 02/10/2018.  He had developed mouth sores, loss of weight and severe lack of energy.  Lenvatinib was held on 02/23/2018. -Lenvatinib was started back at 4 mg daily for 1 week on 03/08/2018, increased to 8 mg daily on 03/12/2018.  He is tolerating it very well.  He has developed hoarseness of his voice since he started back on lenvatinib.  It comes and goes. -I have suggested him to start drinking boost/Ensure 1 to 2 cans/day.  I will see him back in 2 weeks with labs.  2.  Sternal pain: He has not taken pain pills since 03/06/2018.  However he has developed some mild pain which seems deep inside for the last few days.  It is not severe enough to take any medication.

## 2018-03-18 NOTE — Progress Notes (Signed)
[name], [place] [address]   CLINIC:  Medical Oncology/Hematology  PCP:  Patient, No Pcp Per No address on file None   REASON FOR VISIT:  Follow-up for metastatic hepatocelluar carcinoma  CURRENT THERAPY: Lenvatinib    INTERVAL HISTORY:  [name] 62 y.o. male returns for routine follow-up for metastatic hepatocellular carcinoma. Patient has started the new medication and the only new side effect was having voice changes. His voice has been horse. It comes and goes throughout the day. No pain. Patient has been drinking ensure but doesn't like the taste so he will try different type of shakes. His taste buds are improved and has been eating small meals. Overall, he tells me he has been feeling pretty well. Energy levels 75%; appetite 75%. Denies any nausea, vomiting, or diarrhea. Denies any fevers or infections.    REVIEW OF SYSTEMS:  Review of Systems  Constitutional: Negative.   HENT:   Positive for voice change.   Eyes: Negative.   Respiratory: Negative.   Cardiovascular: Negative.   Gastrointestinal: Negative.   Endocrine: Negative.   Genitourinary: Negative.    Musculoskeletal: Negative.   Skin: Negative.   Neurological: Negative.   Hematological: Negative.   Psychiatric/Behavioral: Negative.      PAST MEDICAL/SURGICAL HISTORY:  Past Medical History:  Diagnosis Date  . High cholesterol   . PAD (peripheral artery disease) ([place])    Past Surgical History:  Procedure Laterality Date  . BACK SURGERY    . IR RADIOLOGIST EVAL & MGMT  01/12/2018     SOCIAL HISTORY:  Social History   Socioeconomic History  . Marital status: Married    Spouse name: Not on file  . Number of children: Not on file  . Years of education: Not on file  . Highest education level: Not on file  Occupational History  . Not on file  Social Needs  . Financial resource strain: Not on file  . Food insecurity:    Worry: Not on file   Inability: Not on file  . Transportation needs:    Medical: Not on file    Non-medical: Not on file  Tobacco Use  . Smoking status: Former Smoker    Packs/day: 2.00    Years: 40.00    Pack years: 80.00    Types: Cigarettes    Last attempt to quit: 12/09/2012    Years since quitting: 5.2  . Smokeless tobacco: Never Used  Substance and Sexual Activity  . Alcohol use: Yes    Alcohol/week: 9.0 oz    Types: 15 Cans of beer per week    Comment: 6 3times a week  . Drug use: Never  . Sexual activity: Not on file  Lifestyle  . Physical activity:    Days per week: Not on file    Minutes per session: Not on file  . Stress: Not on file  Relationships  . Social connections:    Talks on phone: Not on file    Gets together: Not on file    Attends religious service: Not on file    Active member of club or organization: Not on file    Attends meetings of clubs or organizations: Not on file    Relationship status: Not on file  . Intimate partner violence:    Fear of current or ex partner: Not on file    Emotionally abused: Not on file    Physically abused: Not on file    Forced  sexual activity: Not on file  Other Topics Concern  . Not on file  Social History Narrative  . Not on file    FAMILY HISTORY:  Family History  Problem Relation Age of Onset  . Alzheimer's disease Mother   . Cancer Sister        breast    CURRENT MEDICATIONS:  Outpatient Encounter Medications as of 03/18/2018  Medication Sig  . aspirin EC 81 MG tablet Take 81 mg by mouth daily.  Marland Kitchen atorvastatin (LIPITOR) 40 MG tablet Take 40 mg by mouth daily.  . capsaicin (ZOSTRIX) 0.025 % cream Apply 1 application topically 2 (two) times daily as needed.  . clopidogrel (PLAVIX) 75 MG tablet Take 75 mg by mouth daily.  . diclofenac sodium (VOLTAREN) 1 % GEL Apply 1 application topically 3 (three) times daily.  . Lenvatinib 12 mg daily dose (LENVIMA) 4 (3) MG capsule Take 12 mg by mouth daily.  Marland Kitchen lenvatinib 8 mg daily  dose (LENVIMA) 4 (2) MG capsule Take 2 capsules (8 mg total) by mouth daily.  . ondansetron (ZOFRAN) 8 MG tablet Take 1 tablet (8 mg total) by mouth every 8 (eight) hours as needed for nausea or vomiting.  Marland Kitchen oxyCODONE (ROXICODONE) 15 MG immediate release tablet Take 1 tablet (15 mg total) by mouth every 6 (six) hours as needed for pain.  Marland Kitchen Potassium 95 MG TABS Take 1 tablet by mouth daily as needed (Cramping).  . prochlorperazine (COMPAZINE) 10 MG tablet Take 1 tablet (10 mg total) by mouth every 6 (six) hours as needed for nausea or vomiting.   No facility-administered encounter medications on file as of 03/18/2018.     ALLERGIES:  No Known Allergies   PHYSICAL EXAM:  ECOG Performance status: 1  VITAL SIGNS: BP: 125/84, P: 74, R: 16, TEMP: 97.7, 02: 97%  Physical Exam Abdominal examination did not reveal any soreness.  No palpable hepatosplenomegaly.  LABORATORY DATA:  I have reviewed the labs as listed.  CBC    Component Value Date/Time   WBC 4.6 03/18/2018 0956   RBC 5.12 03/18/2018 0956   HGB 16.8 03/18/2018 0956   HCT 48.4 03/18/2018 0956   PLT 95 (L) 03/18/2018 0956   MCV 94.5 03/18/2018 0956   MCH 32.8 03/18/2018 0956   MCHC 34.7 03/18/2018 0956   RDW 14.6 03/18/2018 0956   LYMPHSABS 1.5 03/18/2018 0956   MONOABS 0.6 03/18/2018 0956   EOSABS 0.2 03/18/2018 0956   BASOSABS 0.0 03/18/2018 0956   CMP Latest Ref Rng & Units 03/18/2018 03/04/2018 02/18/2018  Glucose 70 - 99 mg/dL 166(H) 199(H) 204(H)  BUN 8 - 23 mg/dL 11 13 15   Creatinine 0.61 - 1.24 mg/dL 0.94 1.06 1.20  Sodium 135 - 145 mmol/L 134(L) 133(L) 133(L)  Potassium 3.5 - 5.1 mmol/L 4.1 3.6 3.9  Chloride 98 - 111 mmol/L 105 103 99(L)  CO2 22 - 32 mmol/L 23 22 25   Calcium 8.9 - 10.3 mg/dL 8.6(L) 8.2(L) 8.8(L)  Total Protein 6.5 - 8.1 g/dL 8.6(H) 7.6 8.1  Total Bilirubin 0.3 - 1.2 mg/dL 1.4(H) 1.8(H) 2.6(H)  Alkaline Phos 38 - 126 U/L 102 91 102  AST 15 - 41 U/L 124(H) 146(H) 156(H)  ALT 0 - 44 U/L 76(H)  89(H) 76(H)        ASSESSMENT & PLAN:   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  Metastatic hepatocellular carcinoma to the bones: - PET CT scan dated 12/25/2017 which showed sternal hypermetabolic lesion SUV 1.75, left adrenal mass  with SUV 2.97 along with a liver mass. -Biopsy of the sternal lesion on 12/29/2017 shows metastatic hepatocellular carcinoma.  Child's class A with 6 points.  AFP of 33. -MRI of the abdomen on 01/13/2018 shows large 15.4 cm HCC in segment 4A, with nonocclusive tumor extension into suprahepatic IVC.  Smaller 1.6 cm segment 7 right lower lobe LI-RADS 4 with moderate likelihood of HCC and 1.4 cm caudate lobe lesion.  Bilateral adrenal metastasis, left more than right present. - Sternal radiation therapy finished on 02/09/2018, 5 fractions. - Lenvatinib 12 mg daily started on 02/10/2018.  He had developed mouth sores, loss of weight and severe lack of energy.  Lenvatinib was held on 02/23/2018. -Lenvatinib was started back at 4 mg daily for 1 week on 03/08/2018, increased to 8 mg daily on 03/12/2018.  He is tolerating it very well.  He has developed hoarseness of his voice since he started back on lenvatinib.  It comes and goes. -I have suggested him to start drinking boost/Ensure 1 to 2 cans/day.  I will see him back in 2 weeks with labs.  2.  Sternal pain: He has not taken pain pills since 03/06/2018.  However he has developed some mild pain which seems deep inside for the last few days.  It is not severe enough to take any medication.       Orders placed this encounter:  Orders Placed This Encounter  Procedures  . Magnesium  . CBC with Differential/Platelet  . Comprehensive metabolic panel      Derek Jack, MD [place] [phone]

## 2018-03-23 ENCOUNTER — Telehealth: Payer: Self-pay | Admitting: *Deleted

## 2018-03-23 NOTE — Telephone Encounter (Signed)
Lmom for [name] to have an OV with Dr. Anselm Pancoast to re-evaluate for chemoembolization.Cathren Harsh

## 2018-04-01 ENCOUNTER — Encounter (HOSPITAL_COMMUNITY): Payer: Self-pay | Admitting: Hematology

## 2018-04-01 ENCOUNTER — Inpatient Hospital Stay (HOSPITAL_COMMUNITY): Payer: BLUE CROSS/BLUE SHIELD

## 2018-04-01 ENCOUNTER — Other Ambulatory Visit: Payer: Self-pay

## 2018-04-01 ENCOUNTER — Inpatient Hospital Stay (HOSPITAL_BASED_OUTPATIENT_CLINIC_OR_DEPARTMENT_OTHER): Payer: BLUE CROSS/BLUE SHIELD | Admitting: Hematology

## 2018-04-01 VITALS — BP 116/87 | HR 66 | Temp 98.2°F | Resp 18 | Wt 173.7 lb

## 2018-04-01 DIAGNOSIS — Z79899 Other long term (current) drug therapy: Secondary | ICD-10-CM

## 2018-04-01 DIAGNOSIS — C220 Liver cell carcinoma: Secondary | ICD-10-CM | POA: Diagnosis not present

## 2018-04-01 DIAGNOSIS — C7972 Secondary malignant neoplasm of left adrenal gland: Secondary | ICD-10-CM

## 2018-04-01 DIAGNOSIS — G893 Neoplasm related pain (acute) (chronic): Secondary | ICD-10-CM

## 2018-04-01 DIAGNOSIS — R490 Dysphonia: Secondary | ICD-10-CM

## 2018-04-01 DIAGNOSIS — C7951 Secondary malignant neoplasm of bone: Secondary | ICD-10-CM

## 2018-04-01 DIAGNOSIS — Z7982 Long term (current) use of aspirin: Secondary | ICD-10-CM

## 2018-04-01 DIAGNOSIS — Z923 Personal history of irradiation: Secondary | ICD-10-CM

## 2018-04-01 DIAGNOSIS — C7971 Secondary malignant neoplasm of right adrenal gland: Secondary | ICD-10-CM | POA: Diagnosis not present

## 2018-04-01 DIAGNOSIS — R530 Neoplastic (malignant) related fatigue: Secondary | ICD-10-CM

## 2018-04-01 DIAGNOSIS — R1011 Right upper quadrant pain: Secondary | ICD-10-CM

## 2018-04-01 DIAGNOSIS — Z87891 Personal history of nicotine dependence: Secondary | ICD-10-CM

## 2018-04-01 DIAGNOSIS — R197 Diarrhea, unspecified: Secondary | ICD-10-CM

## 2018-04-01 LAB — CBC WITH DIFFERENTIAL/PLATELET
Basophils Absolute: 0 10*3/uL (ref 0.0–0.1)
Basophils Relative: 0 %
Eosinophils Absolute: 0.1 10*3/uL (ref 0.0–0.7)
Eosinophils Relative: 3 %
HEMATOCRIT: 46.7 % (ref 39.0–52.0)
HEMOGLOBIN: 16.3 g/dL (ref 13.0–17.0)
LYMPHS ABS: 1.4 10*3/uL (ref 0.7–4.0)
LYMPHS PCT: 33 %
MCH: 32.9 pg (ref 26.0–34.0)
MCHC: 34.9 g/dL (ref 30.0–36.0)
MCV: 94.2 fL (ref 78.0–100.0)
Monocytes Absolute: 0.4 10*3/uL (ref 0.1–1.0)
Monocytes Relative: 11 %
NEUTROS PCT: 53 %
Neutro Abs: 2.3 10*3/uL (ref 1.7–7.7)
Platelets: 64 10*3/uL — ABNORMAL LOW (ref 150–400)
RBC: 4.96 MIL/uL (ref 4.22–5.81)
RDW: 14.9 % (ref 11.5–15.5)
WBC: 4.2 10*3/uL (ref 4.0–10.5)

## 2018-04-01 LAB — COMPREHENSIVE METABOLIC PANEL
ALK PHOS: 86 U/L (ref 38–126)
ALT: 112 U/L — AB (ref 0–44)
AST: 175 U/L — ABNORMAL HIGH (ref 15–41)
Albumin: 2.5 g/dL — ABNORMAL LOW (ref 3.5–5.0)
Anion gap: 5 (ref 5–15)
BILIRUBIN TOTAL: 1.6 mg/dL — AB (ref 0.3–1.2)
BUN: 15 mg/dL (ref 8–23)
CALCIUM: 8.2 mg/dL — AB (ref 8.9–10.3)
CO2: 25 mmol/L (ref 22–32)
CREATININE: 1.04 mg/dL (ref 0.61–1.24)
Chloride: 103 mmol/L (ref 98–111)
GFR calc Af Amer: >60 mL/min (ref >60)
Glucose, Bld: 187 mg/dL — ABNORMAL HIGH (ref 70–99)
Potassium: 4 mmol/L (ref 3.5–5.1)
Sodium: 133 mmol/L — ABNORMAL LOW (ref 135–145)
Total Protein: 7.6 g/dL (ref 6.5–8.1)

## 2018-04-01 LAB — MAGNESIUM: Magnesium: 1.8 mg/dL (ref 1.7–2.4)

## 2018-04-01 NOTE — Patient Instructions (Addendum)
[name] at [facility] Discharge Instructions  Please follow up with [place] in 2 weeks with labs. Cut back on the ensure to 1 can  Today you saw Dr. Raliegh Ip.    Thank you for choosing [place] at [facility] to provide your oncology and hematology care.  To afford each patient quality time with our provider, please arrive at least 15 minutes before your scheduled appointment time.   If you have a lab appointment with the [facility] please come in thru the  Main Entrance and check in at the main information desk  You need to re-schedule your appointment should you arrive 10 or more minutes late.  We strive to give you quality time with our providers, and arriving late affects you and other patients whose appointments are after yours.  Also, if you no show three or more times for appointments you may be dismissed from the clinic at the providers discretion.     Again, thank you for choosing [facility].  Our hope is that these requests will decrease the amount of time that you wait before being seen by our physicians.       _____________________________________________________________  Should you have questions after your visit to [facility], please contact our office at [phone] between the hours of 8:30 a.m. and 4:30 p.m.  Voicemails left after 4:30 p.m. will not be returned until the following business day.  For prescription refill requests, have your pharmacy contact our office.       Resources For Cancer Patients and their Caregivers ? American Cancer Society: Can assist with transportation, wigs, general needs, runs Look Good Feel Better.        386-842-1799 ? Cancer Care: Provides financial assistance, online support groups, medication/co-pay assistance.  1-800-813-HOPE 360-316-4062) ? [place] Assists [place] cancer patients and their families through emotional , educational and  financial support.  (587) 310-6080 ? [place] DSS Where to apply for food stamps, Medicaid and utility assistance. 785-591-5113 ? [facility]ATS: Transportation to medical appointments. (629)113-7273 ? Social Security Administration: May apply for disability if have a Stage IV cancer. 714-143-7958 (505)582-4942 ? [employer], Disability and Transit Services: Assists with nutrition, care and transit needs. [place] Support Programs:   > Cancer Support Group  2nd Tuesday of the month 1pm-2pm, Journey Room   > Creative Journey  3rd Tuesday of the month 1130am-1pm, Journey Room

## 2018-04-01 NOTE — Progress Notes (Signed)
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 male returns for routine follow-up for metastatic hepatocellular carcinoma. Patient is here today with his wife. He has been having diarrhea since he started drinking the ensure. He drinks 2 cans a day and is having 2-3 bowel movements a day. He has been having right upper abdominal pain that is deep and intermittent. He is eating three meals a day but they are small. He is maintaining his weight . His energy level and appetite remain good at 75%. He is not wanting to see Dr. Anselm Pancoast at this time. Denies any new pain. Denies any nausea or vomiting.     REVIEW OF SYSTEMS:  Review of Systems  Constitutional: Positive for fatigue.  HENT:  Negative.   Eyes: Negative.   Respiratory: Negative.   Cardiovascular: Negative.   Gastrointestinal: Positive for diarrhea.  Endocrine: Negative.   Genitourinary: Negative.    Musculoskeletal: Negative.   Skin: Negative.   Neurological: Negative.   Hematological: Negative.   Psychiatric/Behavioral: Negative.      PAST MEDICAL/SURGICAL HISTORY:  Past Medical History:  Diagnosis Date  . High cholesterol   . PAD (peripheral artery disease) ([place])    Past Surgical History:  Procedure Laterality Date  . BACK SURGERY    . IR RADIOLOGIST EVAL & MGMT  01/12/2018     SOCIAL HISTORY:  Social History   Socioeconomic History  . Marital status: Married    Spouse name: Not on file  . Number of children: Not on file  . Years of education: Not on file  . Highest education level: Not on file  Occupational History  . Not on file  Social Needs  . Financial resource strain: Not on file  . Food insecurity:    Worry: Not on file   Inability: Not on file  . Transportation needs:    Medical: Not on file    Non-medical: Not on file  Tobacco Use  . Smoking status: Former Smoker    Packs/day: 2.00    Years: 40.00    Pack years: 80.00    Types: Cigarettes    Last attempt to quit: 12/09/2012    Years since quitting: 5.3  . Smokeless tobacco: Never Used  Substance and Sexual Activity  . Alcohol use: Yes    Alcohol/week: 9.0 oz    Types: 15 Cans of beer per week    Comment: 6 3times a week  . Drug use: Never  . Sexual activity: Not on file  Lifestyle  . Physical activity:    Days per week: Not on file    Minutes per session: Not on file  . Stress: Not on file  Relationships  . Social connections:    Talks on phone: Not on file    Gets together: Not on file    Attends religious service: Not on file    Active member of club or organization: Not on file    Attends meetings of clubs or organizations: Not on file    Relationship status: Not on file  . Intimate partner violence:    Fear of current or ex partner: Not on file    Emotionally abused: Not on file    Physically  abused: Not on file    Forced sexual activity: Not on file  Other Topics Concern  . Not on file  Social History Narrative  . Not on file    FAMILY HISTORY:  Family History  Problem Relation Age of Onset  . Alzheimer's disease Mother   . Cancer Sister        breast    CURRENT MEDICATIONS:  Outpatient Encounter Medications as of 04/01/2018  Medication Sig  . aspirin EC 81 MG tablet Take 81 mg by mouth daily.  Marland Kitchen atorvastatin (LIPITOR) 40 MG tablet Take 40 mg by mouth daily.  . capsaicin (ZOSTRIX) 0.025 % cream Apply 1 application topically 2 (two) times daily as needed.  . clopidogrel (PLAVIX) 75 MG tablet Take 75 mg by mouth daily.  . diclofenac sodium (VOLTAREN) 1 % GEL Apply 1 application topically 3 (three) times daily.  . Lenvatinib 12 mg daily dose (LENVIMA) 4 (3) MG capsule Take 12 mg by mouth daily.  Marland Kitchen lenvatinib 8 mg daily  dose (LENVIMA) 4 (2) MG capsule Take 2 capsules (8 mg total) by mouth daily.  . ondansetron (ZOFRAN) 8 MG tablet Take 1 tablet (8 mg total) by mouth every 8 (eight) hours as needed for nausea or vomiting.  Marland Kitchen oxyCODONE (ROXICODONE) 15 MG immediate release tablet Take 1 tablet (15 mg total) by mouth every 6 (six) hours as needed for pain.  Marland Kitchen Potassium 95 MG TABS Take 1 tablet by mouth daily as needed (Cramping).  . prochlorperazine (COMPAZINE) 10 MG tablet Take 1 tablet (10 mg total) by mouth every 6 (six) hours as needed for nausea or vomiting.   No facility-administered encounter medications on file as of 04/01/2018.     ALLERGIES:  No Known Allergies   PHYSICAL EXAM:  ECOG Performance status: 1  Vitals:   04/01/18 0936  BP: 116/87  Pulse: 66  Resp: 18  Temp: 98.2 F (36.8 C)  SpO2: 99%   Filed Weights   04/01/18 0936  Weight: 173 lb 11.2 oz (78.8 kg)    Physical Exam Abdomen: Left part of the liver is palpable on deep inspiration.  No tenderness in the abdomen.  No tenderness in the sternal area.  LABORATORY DATA:  I have reviewed the labs as listed.  CBC    Component Value Date/Time   WBC 4.2 04/01/2018 0839   RBC 4.96 04/01/2018 0839   HGB 16.3 04/01/2018 0839   HCT 46.7 04/01/2018 0839   PLT 64 (L) 04/01/2018 0839   MCV 94.2 04/01/2018 0839   MCH 32.9 04/01/2018 0839   MCHC 34.9 04/01/2018 0839   RDW 14.9 04/01/2018 0839   LYMPHSABS 1.4 04/01/2018 0839   MONOABS 0.4 04/01/2018 0839   EOSABS 0.1 04/01/2018 0839   BASOSABS 0.0 04/01/2018 0839   CMP Latest Ref Rng & Units 04/01/2018 03/18/2018 03/04/2018  Glucose 70 - 99 mg/dL 187(H) 166(H) 199(H)  BUN 8 - 23 mg/dL 15 11 13   Creatinine 0.61 - 1.24 mg/dL 1.04 0.94 1.06  Sodium 135 - 145 mmol/L 133(L) 134(L) 133(L)  Potassium 3.5 - 5.1 mmol/L 4.0 4.1 3.6  Chloride 98 - 111 mmol/L 103 105 103  CO2 22 - 32 mmol/L 25 23 22   Calcium 8.9 - 10.3 mg/dL 8.2(L) 8.6(L) 8.2(L)  Total Protein 6.5 - 8.1 g/dL 7.6 8.6(H) 7.6    Total Bilirubin 0.3 - 1.2 mg/dL 1.6(H) 1.4(H) 1.8(H)  Alkaline Phos 38 - 126 U/L 86 102 91  AST 15 - 41 U/L 175(H) 124(H) 146(H)  ALT 0 - 44 U/L 112(H) 76(H) 89(H)       ASSESSMENT & PLAN:   Metastatic hepatocellular carcinoma to bone ([place]) 1.  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 -Biopsy of the sternal lesion on 12/29/2017 shows metastatic hepatocellular carcinoma.  Child's class A with 6 points.  AFP of 33. -MRI of the abdomen on 01/13/2018 shows large 15.4 cm HCC in segment 4A, with nonocclusive tumor extension into suprahepatic IVC.  Smaller 1.6 cm segment 7 right lower lobe LI-RADS 4 with moderate likelihood of HCC and 1.4 cm caudate lobe lesion.  Bilateral adrenal metastasis, left more than right present. - Sternal radiation therapy finished on 02/09/2018, 5 fractions. - Lenvatinib 12 mg daily started on 02/10/2018.  He had developed mouth sores, loss of weight and severe lack of energy.  Lenvatinib was held on 02/23/2018. -Lenvatinib was started back at 4 mg daily for 1 week on 03/08/2018, increased to 8 mg daily on 03/12/2018.  He is tolerating it very well.  He has developed hoarseness of his voice since he started back on lenvatinib.  It comes and goes. - He had some diarrhea up to 3 loose stools per day since he started drinking Ensure.  He was drinking 2 cans of 8 ounce and sure.  I have suggested him to cut back to 1 can.  He was also using Imodium. -His AST has increased to 175 from 124.  ALT was 112, up from 76.  Total bilirubin also went up slightly to 1.6 from 1.4.  If he develops any grade 3 or 4 elevation of liver enzymes, will withhold therapy and resume it once its back to baseline.  I will reevaluate him in 2 weeks with repeat blood counts.  We will consider doing CT scans in the next 4 weeks.  He does not want to pursue the Y 90  option at this time.  2.  Sternal pain: He has not taken pain pills since 03/06/2018.  He continues to have some mild pain deep inside his sternum.  He is not requiring any medications.      Orders placed this encounter:  Orders Placed This Encounter  Procedures  . CBC with Differential/Platelet  . Comprehensive metabolic panel      Derek Jack, MD [place] [phone]

## 2018-04-01 NOTE — Assessment & Plan Note (Addendum)
1.  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 -Biopsy of the sternal lesion on 12/29/2017 shows metastatic hepatocellular carcinoma.  Child's class A with 6 points.  AFP of 33. -MRI of the abdomen on 01/13/2018 shows large 15.4 cm HCC in segment 4A, with nonocclusive tumor extension into suprahepatic IVC.  Smaller 1.6 cm segment 7 right lower lobe LI-RADS 4 with moderate likelihood of HCC and 1.4 cm caudate lobe lesion.  Bilateral adrenal metastasis, left more than right present. - Sternal radiation therapy finished on 02/09/2018, 5 fractions. - Lenvatinib 12 mg daily started on 02/10/2018.  He had developed mouth sores, loss of weight and severe lack of energy.  Lenvatinib was held on 02/23/2018. -Lenvatinib was started back at 4 mg daily for 1 week on 03/08/2018, increased to 8 mg daily on 03/12/2018.  He is tolerating it very well.  He has developed hoarseness of his voice since he started back on lenvatinib.  It comes and goes. - He had some diarrhea up to 3 loose stools per day since he started drinking Ensure.  He was drinking 2 cans of 8 ounce and sure.  I have suggested him to cut back to 1 can.  He was also using Imodium. -His AST has increased to 175 from 124.  ALT was 112, up from 76.  Total bilirubin also went up slightly to 1.6 from 1.4.  If he develops any grade 3 or 4 elevation of liver enzymes, will withhold therapy and resume it once its back to baseline.  I will reevaluate him in 2 weeks with repeat blood counts.  We will consider doing CT scans in the next 4 weeks.  He does not want to pursue the Y 90 option at this time.  2.  Sternal pain: He has not taken pain pills since 03/06/2018.  He continues to have some mild pain deep inside his sternum.  He is not requiring any medications.

## 2018-04-12 ENCOUNTER — Other Ambulatory Visit (HOSPITAL_COMMUNITY): Payer: Self-pay | Admitting: *Deleted

## 2018-04-12 MED ORDER — LENVATINIB (8 MG DAILY DOSE) 2 X 4 MG PO CPPK: 8.0000 mg | ORAL_CAPSULE | Freq: Every day | ORAL | 0 refills | Status: DC

## 2018-04-12 NOTE — Telephone Encounter (Signed)
Chart reviewed, linvima refilled

## 2018-04-15 ENCOUNTER — Other Ambulatory Visit: Payer: Self-pay

## 2018-04-15 ENCOUNTER — Inpatient Hospital Stay (HOSPITAL_COMMUNITY): Payer: BLUE CROSS/BLUE SHIELD | Attending: Hematology | Admitting: Hematology

## 2018-04-15 ENCOUNTER — Inpatient Hospital Stay (HOSPITAL_COMMUNITY): Payer: BLUE CROSS/BLUE SHIELD

## 2018-04-15 ENCOUNTER — Encounter (HOSPITAL_COMMUNITY): Payer: Self-pay | Admitting: Hematology

## 2018-04-15 VITALS — BP 118/84 | HR 75 | Temp 98.0°F | Resp 16 | Wt 173.0 lb

## 2018-04-15 DIAGNOSIS — Z923 Personal history of irradiation: Secondary | ICD-10-CM | POA: Insufficient documentation

## 2018-04-15 DIAGNOSIS — R05 Cough: Secondary | ICD-10-CM | POA: Diagnosis not present

## 2018-04-15 DIAGNOSIS — C7951 Secondary malignant neoplasm of bone: Principal | ICD-10-CM

## 2018-04-15 DIAGNOSIS — R0789 Other chest pain: Secondary | ICD-10-CM | POA: Insufficient documentation

## 2018-04-15 DIAGNOSIS — R197 Diarrhea, unspecified: Secondary | ICD-10-CM | POA: Diagnosis not present

## 2018-04-15 DIAGNOSIS — Z79899 Other long term (current) drug therapy: Secondary | ICD-10-CM

## 2018-04-15 DIAGNOSIS — Z87891 Personal history of nicotine dependence: Secondary | ICD-10-CM | POA: Diagnosis not present

## 2018-04-15 DIAGNOSIS — C220 Liver cell carcinoma: Secondary | ICD-10-CM

## 2018-04-15 DIAGNOSIS — Z7982 Long term (current) use of aspirin: Secondary | ICD-10-CM | POA: Diagnosis not present

## 2018-04-15 DIAGNOSIS — I739 Peripheral vascular disease, unspecified: Secondary | ICD-10-CM | POA: Insufficient documentation

## 2018-04-15 DIAGNOSIS — C7972 Secondary malignant neoplasm of left adrenal gland: Secondary | ICD-10-CM | POA: Diagnosis not present

## 2018-04-15 DIAGNOSIS — R071 Chest pain on breathing: Secondary | ICD-10-CM

## 2018-04-15 DIAGNOSIS — Z7902 Long term (current) use of antithrombotics/antiplatelets: Secondary | ICD-10-CM | POA: Insufficient documentation

## 2018-04-15 DIAGNOSIS — C7971 Secondary malignant neoplasm of right adrenal gland: Secondary | ICD-10-CM | POA: Insufficient documentation

## 2018-04-15 LAB — CBC WITH DIFFERENTIAL/PLATELET
BASOS ABS: 0 10*3/uL (ref 0.0–0.1)
Basophils Relative: 0 %
EOS PCT: 4 %
Eosinophils Absolute: 0.1 10*3/uL (ref 0.0–0.7)
HCT: 48 % (ref 39.0–52.0)
Hemoglobin: 16.8 g/dL (ref 13.0–17.0)
LYMPHS PCT: 35 %
Lymphs Abs: 1.4 10*3/uL (ref 0.7–4.0)
MCH: 33 pg (ref 26.0–34.0)
MCHC: 35 g/dL (ref 30.0–36.0)
MCV: 94.3 fL (ref 78.0–100.0)
Monocytes Absolute: 0.6 10*3/uL (ref 0.1–1.0)
Monocytes Relative: 15 %
Neutro Abs: 1.8 10*3/uL (ref 1.7–7.7)
Neutrophils Relative %: 46 %
PLATELETS: 66 10*3/uL — AB (ref 150–400)
RBC: 5.09 MIL/uL (ref 4.22–5.81)
RDW: 15 % (ref 11.5–15.5)
WBC: 3.9 10*3/uL — AB (ref 4.0–10.5)

## 2018-04-15 LAB — COMPREHENSIVE METABOLIC PANEL
ALBUMIN: 2.5 g/dL — AB (ref 3.5–5.0)
ALT: 75 U/L — AB (ref 0–44)
AST: 114 U/L — ABNORMAL HIGH (ref 15–41)
Alkaline Phosphatase: 90 U/L (ref 38–126)
Anion gap: 7 (ref 5–15)
BUN: 14 mg/dL (ref 8–23)
CHLORIDE: 105 mmol/L (ref 98–111)
CO2: 21 mmol/L — ABNORMAL LOW (ref 22–32)
CREATININE: 0.75 mg/dL (ref 0.61–1.24)
Calcium: 8.3 mg/dL — ABNORMAL LOW (ref 8.9–10.3)
GFR calc Af Amer: >60 mL/min (ref >60)
GFR calc non Af Amer: >60 mL/min (ref >60)
Glucose, Bld: 179 mg/dL — ABNORMAL HIGH (ref 70–99)
Potassium: 3.9 mmol/L (ref 3.5–5.1)
Sodium: 133 mmol/L — ABNORMAL LOW (ref 135–145)
Total Bilirubin: 1.9 mg/dL — ABNORMAL HIGH (ref 0.3–1.2)
Total Protein: 7.7 g/dL (ref 6.5–8.1)

## 2018-04-15 NOTE — Patient Instructions (Signed)
[place] at [facility] Discharge Instructions  Follow up in 3 weeks with labs and a MRI prior to your visit   Thank you for choosing [place] at [facility] to provide your oncology and hematology care.  To afford each patient quality time with our provider, please arrive at least 15 minutes before your scheduled appointment time.   If you have a lab appointment with the [facility] please come in thru the  Main Entrance and check in at the main information desk  You need to re-schedule your appointment should you arrive 10 or more minutes late.  We strive to give you quality time with our providers, and arriving late affects you and other patients whose appointments are after yours.  Also, if you no show three or more times for appointments you may be dismissed from the clinic at the providers discretion.     Again, thank you for choosing [facility].  Our hope is that these requests will decrease the amount of time that you wait before being seen by our physicians.       _____________________________________________________________  Should you have questions after your visit to [facility], please contact our office at [phone] between the hours of 8:00 a.m. and 4:30 p.m.  Voicemails left after 4:00 p.m. will not be returned until the following business day.  For prescription refill requests, have your pharmacy contact our office and allow 72 hours.    Cancer Center Support Programs:   > Cancer Support Group  2nd Tuesday of the month 1pm-2pm, Journey Room

## 2018-04-15 NOTE — Assessment & Plan Note (Signed)
1.  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 -Biopsy of the sternal lesion on 12/29/2017 shows metastatic hepatocellular carcinoma.  Child's class A with 6 points.  AFP of 33. -MRI of the abdomen on 01/13/2018 shows large 15.4 cm HCC in segment 4A, with nonocclusive tumor extension into suprahepatic IVC.  Smaller 1.6 cm segment 7 right lower lobe LI-RADS 4 with moderate likelihood of HCC and 1.4 cm caudate lobe lesion.  Bilateral adrenal metastasis, left more than right present. - Sternal radiation therapy finished on 02/09/2018, 5 fractions. - Lenvatinib 12 mg daily started on 02/10/2018.  He had developed mouth sores, loss of weight and severe lack of energy.  Lenvatinib was held on 02/23/2018. -Lenvatinib was started back at 4 mg daily for 1 week on 03/08/2018, increased to 8 mg daily on 03/12/2018.  He is tolerating it very well.  He has developed hoarseness of his voice since he started back on lenvatinib.  It comes and goes. - He had some diarrhea up to 3 loose stools per day since he started drinking Ensure.  He was drinking 2 cans of 8 ounce and sure.  I have suggested him to cut back to 1 can.  He was also using 2 Imodium daily as needed. - His LFTs have improved from last time.  Moderate thrombus cytopenia is stable at 66.  Albumin is low at 2.5.  Total bilirubin went up to 1.9.  We will continue lenvatinib at the same dose at this time.  I will see him back in 3 weeks for follow-up.  I plan to repeat MRI of the liver to see response.  2.  Sternal pain: He has not taken pain pills since 03/06/2018.  He continues to have some mild pain deep inside his sternum.  He is not requiring any medications.

## 2018-04-15 NOTE — Progress Notes (Signed)
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 male returns for routine follow-up for hepatocellular carcinoma to the bones. Patient is here today with his wife. Patient has had a few episodes of diarrhea however the imodium helps with this. Patient is having intermittent chest pain. It happens only when he coughs or sneezes and goes away immediately. Has no other complaint. Otherwise he is feeling good. Patient lives at home and he tries to stay active. His energy level is 75% and his appetite is remaining good at 75%. His weight is stable. His taste has not changed and eats any food he wants.     REVIEW OF SYSTEMS:  Review of Systems  Constitutional: Negative.   HENT:  Negative.   Eyes: Negative.   Respiratory: Negative.   Cardiovascular: Positive for chest pain.  Gastrointestinal: Positive for diarrhea.  Endocrine: Negative.   Genitourinary: Negative.    Musculoskeletal: Negative.   Skin: Negative.   Neurological: Negative.   Hematological: Bruises/bleeds easily.  Psychiatric/Behavioral: Negative.      PAST MEDICAL/SURGICAL HISTORY:  Past Medical History:  Diagnosis Date  . High cholesterol   . PAD (peripheral artery disease) ([place])    Past Surgical History:  Procedure Laterality Date  . BACK SURGERY    . IR RADIOLOGIST EVAL & MGMT  01/12/2018     SOCIAL HISTORY:  Social History   Socioeconomic History  . Marital status: Married    Spouse name: Not on file  . Number of children: Not on file  . Years of education: Not on file  . Highest education level: Not on file  Occupational History  . Not on file  Social Needs  . Financial resource strain: Not on file  . Food insecurity:    Worry: Not  on file    Inability: Not on file  . Transportation needs:    Medical: Not on file    Non-medical: Not on file  Tobacco Use  . Smoking status: Former Smoker    Packs/day: 2.00    Years: 40.00    Pack years: 80.00    Types: Cigarettes    Last attempt to quit: 12/09/2012    Years since quitting: 5.3  . Smokeless tobacco: Never Used  Substance and Sexual Activity  . Alcohol use: Yes    Alcohol/week: 15.0 standard drinks    Types: 15 Cans of beer per week    Comment: 6 3times a week  . Drug use: Never  . Sexual activity: Not on file  Lifestyle  . Physical activity:    Days per week: Not on file    Minutes per session: Not on file  . Stress: Not on file  Relationships  . Social connections:    Talks on phone: Not on file    Gets together: Not on file    Attends religious service: Not on file    Active member of club or organization: Not on file    Attends meetings of clubs or organizations: Not on file    Relationship status: Not on file  . Intimate partner violence:    Fear of current or ex partner: Not on file    Emotionally abused: Not on file    Physically abused:  Not on file    Forced sexual activity: Not on file  Other Topics Concern  . Not on file  Social History Narrative  . Not on file    FAMILY HISTORY:  Family History  Problem Relation Age of Onset  . Alzheimer's disease Mother   . Cancer Sister        breast    CURRENT MEDICATIONS:  Outpatient Encounter Medications as of 04/15/2018  Medication Sig  . aspirin EC 81 MG tablet Take 81 mg by mouth daily.  Marland Kitchen atorvastatin (LIPITOR) 40 MG tablet Take 40 mg by mouth daily.  . clopidogrel (PLAVIX) 75 MG tablet Take 75 mg by mouth daily.  . diclofenac sodium (VOLTAREN) 1 % GEL Apply 1 application topically 3 (three) times daily.  Marland Kitchen lenvatinib 8 mg daily dose (LENVIMA) 2 x 4 MG capsule Take 2 capsules (8 mg total) by mouth daily.  . ondansetron (ZOFRAN) 8 MG tablet Take 1 tablet (8 mg total) by mouth every 8  (eight) hours as needed for nausea or vomiting.  Marland Kitchen oxyCODONE (ROXICODONE) 15 MG immediate release tablet Take 1 tablet (15 mg total) by mouth every 6 (six) hours as needed for pain.  Marland Kitchen Potassium 95 MG TABS Take 1 tablet by mouth daily as needed (Cramping).  . prochlorperazine (COMPAZINE) 10 MG tablet Take 1 tablet (10 mg total) by mouth every 6 (six) hours as needed for nausea or vomiting.  . [DISCONTINUED] capsaicin (ZOSTRIX) 0.025 % cream Apply 1 application topically 2 (two) times daily as needed.  . [DISCONTINUED] Lenvatinib 12 mg daily dose (LENVIMA) 4 (3) MG capsule Take 12 mg by mouth daily.   No facility-administered encounter medications on file as of 04/15/2018.     ALLERGIES:  No Known Allergies   PHYSICAL EXAM:  ECOG Performance status: 1  Vitals:   04/15/18 1408  BP: 118/84  Pulse: 75  Resp: 16  Temp: 98 F (36.7 C)  SpO2: 96%   Filed Weights   04/15/18 1408  Weight: 173 lb (78.5 kg)    Physical Exam  Constitutional: He is oriented to person, place, and time. He appears well-developed and well-nourished.  Cardiovascular: Normal rate, regular rhythm and normal heart sounds.  Pulmonary/Chest: Effort normal and breath sounds normal.  Abdominal: Soft.  Neurological: He is alert and oriented to person, place, and time.  Skin: Skin is warm and dry.     LABORATORY DATA:  I have reviewed the labs as listed.  CBC    Component Value Date/Time   WBC 3.9 (L) 04/15/2018 1304   RBC 5.09 04/15/2018 1304   HGB 16.8 04/15/2018 1304   HCT 48.0 04/15/2018 1304   PLT 66 (L) 04/15/2018 1304   MCV 94.3 04/15/2018 1304   MCH 33.0 04/15/2018 1304   MCHC 35.0 04/15/2018 1304   RDW 15.0 04/15/2018 1304   LYMPHSABS 1.4 04/15/2018 1304   MONOABS 0.6 04/15/2018 1304   EOSABS 0.1 04/15/2018 1304   BASOSABS 0.0 04/15/2018 1304   CMP Latest Ref Rng & Units 04/15/2018 04/01/2018 03/18/2018  Glucose 70 - 99 mg/dL 179(H) 187(H) 166(H)  BUN 8 - 23 mg/dL 14 15 11   Creatinine 0.61 -  1.24 mg/dL 0.75 1.04 0.94  Sodium 135 - 145 mmol/L 133(L) 133(L) 134(L)  Potassium 3.5 - 5.1 mmol/L 3.9 4.0 4.1  Chloride 98 - 111 mmol/L 105 103 105  CO2 22 - 32 mmol/L 21(L) 25 23  Calcium 8.9 - 10.3 mg/dL 8.3(L) 8.2(L) 8.6(L)  Total Protein 6.5 -  8.1 g/dL 7.7 7.6 8.6(H)  Total Bilirubin 0.3 - 1.2 mg/dL 1.9(H) 1.6(H) 1.4(H)  Alkaline Phos 38 - 126 U/L 90 86 102  AST 15 - 41 U/L 114(H) 175(H) 124(H)  ALT 0 - 44 U/L 75(H) 112(H) 76(H)     ASSESSMENT & PLAN:   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 -Biopsy of the sternal lesion on 12/29/2017 shows metastatic hepatocellular carcinoma.  Child's class A with 6 points.  AFP of 33. -MRI of the abdomen on 01/13/2018 shows large 15.4 cm HCC in segment 4A, with nonocclusive tumor extension into suprahepatic IVC.  Smaller 1.6 cm segment 7 right lower lobe LI-RADS 4 with moderate likelihood of HCC and 1.4 cm caudate lobe lesion.  Bilateral adrenal metastasis, left more than right present. - Sternal radiation therapy finished on 02/09/2018, 5 fractions. - Lenvatinib 12 mg daily started on 02/10/2018.  He had developed mouth sores, loss of weight and severe lack of energy.  Lenvatinib was held on 02/23/2018. -Lenvatinib was started back at 4 mg daily for 1 week on 03/08/2018, increased to 8 mg daily on 03/12/2018.  He is tolerating it very well.  He has developed hoarseness of his voice since he started back on lenvatinib.  It comes and goes. - He had some diarrhea up to 3 loose stools per day since he started drinking Ensure.  He was drinking 2 cans of 8 ounce and sure.  I have suggested him to cut back to 1 can.  He was also using 2 Imodium daily as needed. - His LFTs have improved from last time.  Moderate thrombus cytopenia is stable at 66.  Albumin is low at  2.5.  Total bilirubin went up to 1.9.  We will continue lenvatinib at the same dose at this time.  I will see him back in 3 weeks for follow-up.  I plan to repeat MRI of the liver to see response.  2.  Sternal pain: He has not taken pain pills since 03/06/2018.  He continues to have some mild pain deep inside his sternum.  He is not requiring any medications.      Orders placed this encounter:  Orders Placed This Encounter  Procedures  . MR Abdomen W Wo Contrast  . CBC with Differential/Platelet  . Comprehensive metabolic panel      Derek Jack, MD [place] [phone]

## 2018-05-03 ENCOUNTER — Other Ambulatory Visit (HOSPITAL_COMMUNITY): Payer: Self-pay | Admitting: Nurse Practitioner

## 2018-05-03 ENCOUNTER — Inpatient Hospital Stay (HOSPITAL_COMMUNITY): Payer: BLUE CROSS/BLUE SHIELD

## 2018-05-03 ENCOUNTER — Ambulatory Visit (HOSPITAL_COMMUNITY)
Admission: RE | Admit: 2018-05-03 | Discharge: 2018-05-03 | Disposition: A | Discharge status: Home / Self Care | Payer: BLUE CROSS/BLUE SHIELD | Source: Ambulatory Visit | Attending: Nurse Practitioner | Admitting: Nurse Practitioner

## 2018-05-03 DIAGNOSIS — C7951 Secondary malignant neoplasm of bone: Secondary | ICD-10-CM | POA: Insufficient documentation

## 2018-05-03 DIAGNOSIS — K7460 Unspecified cirrhosis of liver: Secondary | ICD-10-CM | POA: Diagnosis not present

## 2018-05-03 DIAGNOSIS — C7972 Secondary malignant neoplasm of left adrenal gland: Secondary | ICD-10-CM | POA: Diagnosis not present

## 2018-05-03 DIAGNOSIS — C7971 Secondary malignant neoplasm of right adrenal gland: Secondary | ICD-10-CM | POA: Insufficient documentation

## 2018-05-03 DIAGNOSIS — C220 Liver cell carcinoma: Secondary | ICD-10-CM

## 2018-05-03 DIAGNOSIS — I700 Atherosclerosis of aorta: Secondary | ICD-10-CM | POA: Insufficient documentation

## 2018-05-03 LAB — CBC WITH DIFFERENTIAL/PLATELET
Basophils Absolute: 0 10*3/uL (ref 0.0–0.1)
Basophils Relative: 0 %
EOS PCT: 3 %
Eosinophils Absolute: 0.1 10*3/uL (ref 0.0–0.7)
HEMATOCRIT: 46.3 % (ref 39.0–52.0)
Hemoglobin: 16.3 g/dL (ref 13.0–17.0)
LYMPHS ABS: 1.2 10*3/uL (ref 0.7–4.0)
LYMPHS PCT: 30 %
MCH: 33.5 pg (ref 26.0–34.0)
MCHC: 35.2 g/dL (ref 30.0–36.0)
MCV: 95.1 fL (ref 78.0–100.0)
MONO ABS: 0.6 10*3/uL (ref 0.1–1.0)
MONOS PCT: 13 %
Neutro Abs: 2.2 10*3/uL (ref 1.7–7.7)
Neutrophils Relative %: 54 %
Platelets: 54 10*3/uL — ABNORMAL LOW (ref 150–400)
RBC: 4.87 MIL/uL (ref 4.22–5.81)
RDW: 15 % (ref 11.5–15.5)
WBC: 4.1 10*3/uL (ref 4.0–10.5)

## 2018-05-03 LAB — COMPREHENSIVE METABOLIC PANEL
ALT: 79 U/L — ABNORMAL HIGH (ref 0–44)
ANION GAP: 6 (ref 5–15)
AST: 107 U/L — AB (ref 15–41)
Albumin: 2.4 g/dL — ABNORMAL LOW (ref 3.5–5.0)
Alkaline Phosphatase: 91 U/L (ref 38–126)
BILIRUBIN TOTAL: 1.3 mg/dL — AB (ref 0.3–1.2)
BUN: 15 mg/dL (ref 8–23)
CO2: 24 mmol/L (ref 22–32)
Calcium: 8.2 mg/dL — ABNORMAL LOW (ref 8.9–10.3)
Chloride: 105 mmol/L (ref 98–111)
Creatinine, Ser: 0.87 mg/dL (ref 0.61–1.24)
Glucose, Bld: 185 mg/dL — ABNORMAL HIGH (ref 70–99)
Potassium: 4.1 mmol/L (ref 3.5–5.1)
Sodium: 135 mmol/L (ref 135–145)
TOTAL PROTEIN: 7.2 g/dL (ref 6.5–8.1)

## 2018-05-03 MED ORDER — GADOBENATE DIMEGLUMINE 529 MG/ML IV SOLN
15.0000 mL | Freq: Once | INTRAVENOUS | Status: AC | PRN
  Administered 2018-05-03: 15 mL via INTRAVENOUS

## 2018-05-04 ENCOUNTER — Other Ambulatory Visit: Payer: Self-pay

## 2018-05-04 ENCOUNTER — Inpatient Hospital Stay (HOSPITAL_BASED_OUTPATIENT_CLINIC_OR_DEPARTMENT_OTHER): Payer: BLUE CROSS/BLUE SHIELD | Admitting: Hematology

## 2018-05-04 ENCOUNTER — Encounter (HOSPITAL_COMMUNITY): Payer: Self-pay | Admitting: Hematology

## 2018-05-04 VITALS — BP 117/86 | HR 76 | Temp 97.6°F | Resp 20 | Wt 175.0 lb

## 2018-05-04 DIAGNOSIS — Z7951 Long term (current) use of inhaled steroids: Secondary | ICD-10-CM

## 2018-05-04 DIAGNOSIS — R05 Cough: Secondary | ICD-10-CM

## 2018-05-04 DIAGNOSIS — Z7982 Long term (current) use of aspirin: Secondary | ICD-10-CM

## 2018-05-04 DIAGNOSIS — C220 Liver cell carcinoma: Secondary | ICD-10-CM

## 2018-05-04 DIAGNOSIS — I739 Peripheral vascular disease, unspecified: Secondary | ICD-10-CM

## 2018-05-04 DIAGNOSIS — C7972 Secondary malignant neoplasm of left adrenal gland: Secondary | ICD-10-CM | POA: Diagnosis not present

## 2018-05-04 DIAGNOSIS — C7971 Secondary malignant neoplasm of right adrenal gland: Secondary | ICD-10-CM | POA: Diagnosis not present

## 2018-05-04 DIAGNOSIS — Z923 Personal history of irradiation: Secondary | ICD-10-CM

## 2018-05-04 DIAGNOSIS — R0789 Other chest pain: Secondary | ICD-10-CM

## 2018-05-04 DIAGNOSIS — Z87891 Personal history of nicotine dependence: Secondary | ICD-10-CM

## 2018-05-04 DIAGNOSIS — C7951 Secondary malignant neoplasm of bone: Principal | ICD-10-CM

## 2018-05-04 DIAGNOSIS — Z79899 Other long term (current) drug therapy: Secondary | ICD-10-CM

## 2018-05-04 DIAGNOSIS — R197 Diarrhea, unspecified: Secondary | ICD-10-CM

## 2018-05-04 DIAGNOSIS — Z7902 Long term (current) use of antithrombotics/antiplatelets: Secondary | ICD-10-CM

## 2018-05-04 NOTE — Progress Notes (Signed)
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 Patient is here today with his wife. He is tolerating the Lenvatinib well. No issues with the medication. Patient states his chest wall pain is still present when he coughs or sneezes. Patient is experiencing nose bleeds that are hard to get under control sometimes. He coughed up some blood tinged sputum Sunday and today. Patient is taking plavix that Dr. Gibson Ramp put him on. Patient has mild nausea but it doesn't bother him. Patient is having diarrhea 4 times a day. He was taking imodium regularly however it makes him constipated. Patient denies vomiting. Denies any new pains. Denies any SOB. Denies any fevers or recent infection. Patient reports his appetite and energy level at 75%.   REVIEW OF SYSTEMS:  Review of Systems  Gastrointestinal: Positive for diarrhea.  Hematological: Bruises/bleeds easily.  All other systems reviewed and are negative.    PAST MEDICAL/SURGICAL HISTORY:  Past Medical History:  Diagnosis Date  . High cholesterol   . PAD (peripheral artery disease) ([place])    Past Surgical History:  Procedure Laterality Date  . BACK SURGERY    . IR RADIOLOGIST EVAL & MGMT  01/12/2018     SOCIAL HISTORY:  Social History   Socioeconomic History  . Marital status: Married    Spouse name: Not on file  . Number of children: Not on file  . Years of education: Not on file  . Highest education level: Not on file  Occupational History  . Not on file  Social Needs  . Financial resource strain: Not on file  . Food insecurity:    Worry: Not on file    Inability: Not on  file  . Transportation needs:    Medical: Not on file    Non-medical: Not on file  Tobacco Use  . Smoking status: Former Smoker    Packs/day: 2.00    Years: 40.00    Pack years: 80.00    Types: Cigarettes    Last attempt to quit: 12/09/2012    Years since quitting: 5.4  . Smokeless tobacco: Never Used  Substance and Sexual Activity  . Alcohol use: Yes    Alcohol/week: 15.0 standard drinks    Types: 15 Cans of beer per week    Comment: 6 3times a week  . Drug use: Never  . Sexual activity: Not on file  Lifestyle  . Physical activity:    Days per week: Not on file    Minutes per session: Not on file  . Stress: Not on file  Relationships  . Social connections:    Talks on phone: Not on file    Gets together: Not on file    Attends religious service: Not on file    Active member of club or organization: Not on file    Attends meetings of clubs or organizations: Not on file    Relationship status: Not on file  . Intimate partner violence:    Fear of current or ex partner: Not on file    Emotionally abused: Not on file    Physically abused: Not on file    Forced sexual activity: Not  on file  Other Topics Concern  . Not on file  Social History Narrative  . Not on file    FAMILY HISTORY:  Family History  Problem Relation Age of Onset  . Alzheimer's disease Mother   . Cancer Sister        breast    CURRENT MEDICATIONS:  Outpatient Encounter Medications as of 05/04/2018  Medication Sig  . aspirin EC 81 MG tablet Take 81 mg by mouth daily.  Marland Kitchen atorvastatin (LIPITOR) 40 MG tablet Take 40 mg by mouth daily.  . clopidogrel (PLAVIX) 75 MG tablet Take 75 mg by mouth daily.  . diclofenac sodium (VOLTAREN) 1 % GEL Apply 1 application topically 3 (three) times daily.  Marland Kitchen lenvatinib 8 mg daily dose (LENVIMA) 2 x 4 MG capsule Take 2 capsules (8 mg total) by mouth daily.  . ondansetron (ZOFRAN) 8 MG tablet Take 1 tablet (8 mg total) by mouth every 8 (eight) hours as needed for nausea  or vomiting.  Marland Kitchen oxyCODONE (ROXICODONE) 15 MG immediate release tablet Take 1 tablet (15 mg total) by mouth every 6 (six) hours as needed for pain.  Marland Kitchen Potassium 95 MG TABS Take 1 tablet by mouth daily as needed (Cramping).  . prochlorperazine (COMPAZINE) 10 MG tablet Take 1 tablet (10 mg total) by mouth every 6 (six) hours as needed for nausea or vomiting.   No facility-administered encounter medications on file as of 05/04/2018.     ALLERGIES:  No Known Allergies   PHYSICAL EXAM:  ECOG Performance status: 1  Vitals:   05/04/18 1040  BP: 117/86  Pulse: 76  Resp: 20  Temp: 97.6 F (36.4 C)  SpO2: 99%   Filed Weights   05/04/18 1040  Weight: 175 lb (79.4 kg)    Physical Exam  Constitutional: He is oriented to person, place, and time. He appears well-developed and well-nourished.  Neck: Normal range of motion. Neck supple.  Cardiovascular: Normal rate, regular rhythm and normal heart sounds.  Pulmonary/Chest: Effort normal and breath sounds normal.  Abdominal: Soft.  Musculoskeletal: Normal range of motion.  Neurological: He is alert and oriented to person, place, and time.  Skin: Skin is warm and dry.  Psychiatric: He has a normal mood and affect. His behavior is normal. Judgment and thought content normal.  Abdomen: Soft nontender with no palpable abnormal.   LABORATORY DATA:  I have reviewed the labs as listed.  CBC    Component Value Date/Time   WBC 4.1 05/03/2018 0848   RBC 4.87 05/03/2018 0848   HGB 16.3 05/03/2018 0848   HCT 46.3 05/03/2018 0848   PLT 54 (L) 05/03/2018 0848   MCV 95.1 05/03/2018 0848   MCH 33.5 05/03/2018 0848   MCHC 35.2 05/03/2018 0848   RDW 15.0 05/03/2018 0848   LYMPHSABS 1.2 05/03/2018 0848   MONOABS 0.6 05/03/2018 0848   EOSABS 0.1 05/03/2018 0848   BASOSABS 0.0 05/03/2018 0848   CMP Latest Ref Rng & Units 05/03/2018 04/15/2018 04/01/2018  Glucose 70 - 99 mg/dL 185(H) 179(H) 187(H)  BUN 8 - 23 mg/dL 15 14 15   Creatinine 0.61 - 1.24  mg/dL 0.87 0.75 1.04  Sodium 135 - 145 mmol/L 135 133(L) 133(L)  Potassium 3.5 - 5.1 mmol/L 4.1 3.9 4.0  Chloride 98 - 111 mmol/L 105 105 103  CO2 22 - 32 mmol/L 24 21(L) 25  Calcium 8.9 - 10.3 mg/dL 8.2(L) 8.3(L) 8.2(L)  Total Protein 6.5 - 8.1 g/dL 7.2 7.7 7.6  Total Bilirubin 0.3 -  1.2 mg/dL 1.3(H) 1.9(H) 1.6(H)  Alkaline Phos 38 - 126 U/L 91 90 86  AST 15 - 41 U/L 107(H) 114(H) 175(H)  ALT 0 - 44 U/L 79(H) 75(H) 112(H)      Radiology: I have independently reviewed images of the MRI of his abdomen and discussed with the patient.   ASSESSMENT & PLAN:   Metastatic hepatocellular carcinoma to bone ([place]) 1.  Metastatic hepatocellular carcinoma to the bones: - PET CT scan dated 12/25/2017 which showed sternal hypermetabolic lesion SUV 8.50, left adrenal mass with SUV 2.97 along with a liver mass. -Biopsy of the sternal lesion on 12/29/2017 shows metastatic hepatocellular carcinoma.  Child's class A with 6 points.  AFP of 33. -MRI of the abdomen on 01/13/2018 shows large 15.4 cm HCC in segment 4A, with nonocclusive tumor extension into suprahepatic IVC.  Smaller 1.6 cm segment 7 right lower lobe LI-RADS 4 with moderate likelihood of HCC and 1.4 cm caudate lobe lesion.  Bilateral adrenal metastasis, left more than right present. - Sternal radiation therapy finished on 02/09/2018, 5 fractions. - Lenvatinib 12 mg daily started on 02/10/2018.  He had developed mouth sores, loss of weight and severe lack of energy.  Lenvatinib was held on 02/23/2018. -Lenvatinib was started back at 4 mg daily for 1 week on 03/08/2018, increased to 8 mg daily on 03/12/2018.  He is tolerating it very well.  He has developed hoarseness of his voice since he started back on lenvatinib.  It comes and goes. - We discussed the results of the MRI of the abdomen dated 05/03/2018 which shows dominant hepatocellular carcinoma slightly decreased in size with a decrease in size of the caudate lobe lesion.  Posterior segment 7 lesion is  stable.  No new lesions were reported.  There is slight enhancing sternal lesion which is measuring 3.8 x 3.1 cm, previously 3.1 x 2.1 cm.  This could be radiation related change. - His elevated LFTs are more or less stable.  His bilirubin has improved to 1.3.  Hence I have recommended continuing at the same dose of lenvatinib.  I will reevaluate him in 5 weeks. -He had episode of nosebleed once per week and also one episode of hemoptysis few days ago.  He is on Plavix for peripheral arterial disease.  This coupled with his platelet count of 58 is causing the nosebleeds.  If there is any worsening of nosebleeds, I have recommended him to cut back on the Plavix to every other day.  2.  Sternal pain: He has not taken pain pills since 03/06/2018.  He continues to have mild pain in the sternal area when he moves around.  Is not requiring any pain medication on a regular basis.  3.  Diarrhea: -he has up to 4 loose to watery bowel movements per day. -He takes Imodium 2 tablets/week.  If he takes more, he gets constipated.  He takes them only when stools are watery.      Orders placed this encounter:  Orders Placed This Encounter  Procedures  . CBC with Differential/Platelet  . Comprehensive metabolic panel      Derek Jack, MD [place] [phone]

## 2018-05-04 NOTE — Patient Instructions (Signed)
[place] at [facility] Discharge Instructions  Follow up in 5 weeks with Labs.   Thank you for choosing [place] at [facility] to provide your oncology and hematology care.  To afford each patient quality time with our provider, please arrive at least 15 minutes before your scheduled appointment time.   If you have a lab appointment with the [facility] please come in thru the  Main Entrance and check in at the main information desk  You need to re-schedule your appointment should you arrive 10 or more minutes late.  We strive to give you quality time with our providers, and arriving late affects you and other patients whose appointments are after yours.  Also, if you no show three or more times for appointments you may be dismissed from the clinic at the providers discretion.     Again, thank you for choosing [facility].  Our hope is that these requests will decrease the amount of time that you wait before being seen by our physicians.       _____________________________________________________________  Should you have questions after your visit to [facility], please contact our office at [phone] between the hours of 8:00 a.m. and 4:30 p.m.  Voicemails left after 4:00 p.m. will not be returned until the following business day.  For prescription refill requests, have your pharmacy contact our office and allow 72 hours.    Cancer Center Support Programs:   > Cancer Support Group  2nd Tuesday of the month 1pm-2pm, [facility]

## 2018-05-04 NOTE — Assessment & Plan Note (Signed)
1.  Metastatic hepatocellular carcinoma to the bones: - PET CT scan dated 12/25/2017 which showed sternal hypermetabolic lesion SUV 3.83, left adrenal mass with SUV 2.97 along with a liver mass. -Biopsy of the sternal lesion on 12/29/2017 shows metastatic hepatocellular carcinoma.  Child's class A with 6 points.  AFP of 33. -MRI of the abdomen on 01/13/2018 shows large 15.4 cm HCC in segment 4A, with nonocclusive tumor extension into suprahepatic IVC.  Smaller 1.6 cm segment 7 right lower lobe LI-RADS 4 with moderate likelihood of HCC and 1.4 cm caudate lobe lesion.  Bilateral adrenal metastasis, left more than right present. - Sternal radiation therapy finished on 02/09/2018, 5 fractions. - Lenvatinib 12 mg daily started on 02/10/2018.  He had developed mouth sores, loss of weight and severe lack of energy.  Lenvatinib was held on 02/23/2018. -Lenvatinib was started back at 4 mg daily for 1 week on 03/08/2018, increased to 8 mg daily on 03/12/2018.  He is tolerating it very well.  He has developed hoarseness of his voice since he started back on lenvatinib.  It comes and goes. - We discussed the results of the MRI of the abdomen dated 05/03/2018 which shows dominant hepatocellular carcinoma slightly decreased in size with a decrease in size of the caudate lobe lesion.  Posterior segment 7 lesion is stable.  No new lesions were reported.  There is slight enhancing sternal lesion which is measuring 3.8 x 3.1 cm, previously 3.1 x 2.1 cm.  This could be radiation related change. - His elevated LFTs are more or less stable.  His bilirubin has improved to 1.3.  Hence I have recommended continuing at the same dose of lenvatinib.  I will reevaluate him in 5 weeks. -He had episode of nosebleed once per week and also one episode of hemoptysis few days ago.  He is on Plavix for peripheral arterial disease.  This coupled with his platelet count of 58 is causing the nosebleeds.  If there is any worsening of nosebleeds, I have  recommended him to cut back on the Plavix to every other day.  2.  Sternal pain: He has not taken pain pills since 03/06/2018.  He continues to have mild pain in the sternal area when he moves around.  Is not requiring any pain medication on a regular basis.  3.  Diarrhea: -he has up to 4 loose to watery bowel movements per day. -He takes Imodium 2 tablets/week.  If he takes more, he gets constipated.  He takes them only when stools are watery.

## 2018-05-14 ENCOUNTER — Other Ambulatory Visit (HOSPITAL_COMMUNITY): Payer: Self-pay | Admitting: *Deleted

## 2018-05-14 MED ORDER — LENVATINIB (8 MG DAILY DOSE) 2 X 4 MG PO CPPK: 8.0000 mg | ORAL_CAPSULE | Freq: Every day | ORAL | 0 refills | Status: DC

## 2018-05-14 NOTE — Telephone Encounter (Signed)
Chart reviewed, Lenvima refilled.

## 2018-06-10 ENCOUNTER — Other Ambulatory Visit: Payer: Self-pay

## 2018-06-10 ENCOUNTER — Inpatient Hospital Stay (HOSPITAL_COMMUNITY): Payer: BLUE CROSS/BLUE SHIELD

## 2018-06-10 ENCOUNTER — Inpatient Hospital Stay (HOSPITAL_COMMUNITY): Payer: BLUE CROSS/BLUE SHIELD | Attending: Hematology | Admitting: Hematology

## 2018-06-10 ENCOUNTER — Encounter (HOSPITAL_COMMUNITY): Payer: Self-pay | Admitting: Hematology

## 2018-06-10 VITALS — BP 113/83 | HR 78 | Temp 97.4°F | Resp 18 | Wt 168.0 lb

## 2018-06-10 DIAGNOSIS — C220 Liver cell carcinoma: Secondary | ICD-10-CM | POA: Diagnosis present

## 2018-06-10 DIAGNOSIS — R079 Chest pain, unspecified: Secondary | ICD-10-CM

## 2018-06-10 DIAGNOSIS — R634 Abnormal weight loss: Secondary | ICD-10-CM | POA: Diagnosis not present

## 2018-06-10 DIAGNOSIS — Z7982 Long term (current) use of aspirin: Secondary | ICD-10-CM

## 2018-06-10 DIAGNOSIS — Z87891 Personal history of nicotine dependence: Secondary | ICD-10-CM

## 2018-06-10 DIAGNOSIS — C7951 Secondary malignant neoplasm of bone: Secondary | ICD-10-CM | POA: Diagnosis not present

## 2018-06-10 DIAGNOSIS — R1111 Vomiting without nausea: Secondary | ICD-10-CM | POA: Diagnosis not present

## 2018-06-10 DIAGNOSIS — Z923 Personal history of irradiation: Secondary | ICD-10-CM | POA: Diagnosis not present

## 2018-06-10 DIAGNOSIS — R197 Diarrhea, unspecified: Secondary | ICD-10-CM

## 2018-06-10 DIAGNOSIS — R05 Cough: Secondary | ICD-10-CM

## 2018-06-10 DIAGNOSIS — C7972 Secondary malignant neoplasm of left adrenal gland: Secondary | ICD-10-CM

## 2018-06-10 DIAGNOSIS — C7971 Secondary malignant neoplasm of right adrenal gland: Secondary | ICD-10-CM

## 2018-06-10 DIAGNOSIS — Z803 Family history of malignant neoplasm of breast: Secondary | ICD-10-CM | POA: Diagnosis not present

## 2018-06-10 DIAGNOSIS — Z79899 Other long term (current) drug therapy: Secondary | ICD-10-CM | POA: Diagnosis not present

## 2018-06-10 LAB — COMPREHENSIVE METABOLIC PANEL
ALK PHOS: 79 U/L (ref 38–126)
ALT: 54 U/L — AB (ref 0–44)
AST: 92 U/L — ABNORMAL HIGH (ref 15–41)
Albumin: 2.6 g/dL — ABNORMAL LOW (ref 3.5–5.0)
Anion gap: 8 (ref 5–15)
BUN: 22 mg/dL (ref 8–23)
CALCIUM: 8.5 mg/dL — AB (ref 8.9–10.3)
CO2: 25 mmol/L (ref 22–32)
CREATININE: 1.18 mg/dL (ref 0.61–1.24)
Chloride: 103 mmol/L (ref 98–111)
GFR calc Af Amer: >60 mL/min (ref >60)
Glucose, Bld: 154 mg/dL — ABNORMAL HIGH (ref 70–99)
Potassium: 4 mmol/L (ref 3.5–5.1)
Sodium: 136 mmol/L (ref 135–145)
Total Bilirubin: 2.5 mg/dL — ABNORMAL HIGH (ref 0.3–1.2)
Total Protein: 7.3 g/dL (ref 6.5–8.1)

## 2018-06-10 LAB — CBC WITH DIFFERENTIAL/PLATELET
Basophils Absolute: 0 10*3/uL (ref 0.0–0.1)
Basophils Relative: 0 %
EOS PCT: 2 %
Eosinophils Absolute: 0.1 10*3/uL (ref 0.0–0.7)
HCT: 46.1 % (ref 39.0–52.0)
HEMOGLOBIN: 16.3 g/dL (ref 13.0–17.0)
LYMPHS ABS: 1.5 10*3/uL (ref 0.7–4.0)
LYMPHS PCT: 39 %
MCH: 34.8 pg — AB (ref 26.0–34.0)
MCHC: 35.4 g/dL (ref 30.0–36.0)
MCV: 98.5 fL (ref 78.0–100.0)
MONOS PCT: 8 %
Monocytes Absolute: 0.3 10*3/uL (ref 0.1–1.0)
Neutro Abs: 2 10*3/uL (ref 1.7–7.7)
Neutrophils Relative %: 51 %
PLATELETS: 52 10*3/uL — AB (ref 150–400)
RBC: 4.68 MIL/uL (ref 4.22–5.81)
RDW: 15.5 % (ref 11.5–15.5)
WBC: 3.9 10*3/uL — AB (ref 4.0–10.5)

## 2018-06-10 NOTE — Patient Instructions (Signed)
[place] at [facility] Discharge Instructions   Follow up in 4 weeks with MRI and Labs prior to your visit. Stop taking the aspirin. Start drinking the boost plus with increased calories.    Thank you for choosing [place] at [facility] to provide your oncology and hematology care.  To afford each patient quality time with our provider, please arrive at least 15 minutes before your scheduled appointment time.   If you have a lab appointment with the [facility] please come in thru the  Main Entrance and check in at the main information desk  You need to re-schedule your appointment should you arrive 10 or more minutes late.  We strive to give you quality time with our providers, and arriving late affects you and other patients whose appointments are after yours.  Also, if you no show three or more times for appointments you may be dismissed from the clinic at the providers discretion.     Again, thank you for choosing [facility].  Our hope is that these requests will decrease the amount of time that you wait before being seen by our physicians.       _____________________________________________________________  Should you have questions after your visit to [facility], please contact our office at [phone] between the hours of 8:00 a.m. and 4:30 p.m.  Voicemails left after 4:00 p.m. will not be returned until the following business day.  For prescription refill requests, have your pharmacy contact our office and allow 72 hours.    Cancer Center Support Programs:   > Cancer Support Group  2nd Tuesday of the month 1pm-2pm, [facility]

## 2018-06-10 NOTE — Progress Notes (Signed)
[place], [place] [address]   CLINIC:  Medical Oncology/Hematology  PCP:  Patient, No Pcp Per No address on file None   REASON FOR VISIT: Follow-up for hepatocellular carcinoma to the bones  CURRENT THERAPY: Lenvatinib   INTERVAL HISTORY:  Mr. Hermann 62 y.o. male returns for routine follow-up for hepatocellular carcinoma to the bones. Patient is having issues with bleeding in his mouth and nose. He has also had vomiting episodes occasionally. The chest pain is still present only when he coughs. He has also had diarrhea 5-8 times daily for the past week. His taste buds have changed and he can not taste food. He has a decrease in appetite and has lost 7 pounds since his last visit. He denies nausea or constipation.    REVIEW OF SYSTEMS:  Review of Systems  HENT:   Positive for nosebleeds.   Gastrointestinal: Positive for diarrhea and vomiting.  Hematological: Bruises/bleeds easily.  All other systems reviewed and are negative.    PAST MEDICAL/SURGICAL HISTORY:  Past Medical History:  Diagnosis Date  . High cholesterol   . PAD (peripheral artery disease) ([place])    Past Surgical History:  Procedure Laterality Date  . BACK SURGERY    . IR RADIOLOGIST EVAL & MGMT  01/12/2018     SOCIAL HISTORY:  Social History   Socioeconomic History  . Marital status: Married    Spouse name: Not on file  . Number of children: Not on file  . Years of education: Not on file  . Highest education level: Not on file  Occupational History  . Not on file  Social Needs  . Financial resource strain: Not on file  . Food insecurity:    Worry: Not on file    Inability: Not on file  . Transportation needs:    Medical: Not on file    Non-medical: Not on file  Tobacco Use  . Smoking status: Former Smoker    Packs/day: 2.00    Years: 40.00    Pack years: 80.00    Types: Cigarettes    Last attempt to quit: 12/09/2012    Years since quitting: 5.5  .  Smokeless tobacco: Never Used  Substance and Sexual Activity  . Alcohol use: Yes    Alcohol/week: 15.0 standard drinks    Types: 15 Cans of beer per week    Comment: 6 3times a week  . Drug use: Never  . Sexual activity: Not on file  Lifestyle  . Physical activity:    Days per week: Not on file    Minutes per session: Not on file  . Stress: Not on file  Relationships  . Social connections:    Talks on phone: Not on file    Gets together: Not on file    Attends religious service: Not on file    Active member of club or organization: Not on file    Attends meetings of clubs or organizations: Not on file    Relationship status: Not on file  . Intimate partner violence:    Fear of current or ex partner: Not on file    Emotionally abused: Not on file    Physically abused: Not on file    Forced sexual activity: Not on file  Other Topics Concern  . Not on file  Social History Narrative  . Not on file    FAMILY HISTORY:  Family History  Problem Relation Age of Onset  . Alzheimer's disease  Mother   . Cancer Sister        breast    CURRENT MEDICATIONS:  Outpatient Encounter Medications as of 06/10/2018  Medication Sig  . aspirin EC 81 MG tablet Take 81 mg by mouth daily.  Marland Kitchen atorvastatin (LIPITOR) 40 MG tablet Take 40 mg by mouth daily.  . clopidogrel (PLAVIX) 75 MG tablet Take 75 mg by mouth daily.  . diclofenac sodium (VOLTAREN) 1 % GEL Apply 1 application topically 3 (three) times daily.  Marland Kitchen lenvatinib 8 mg daily dose (LENVIMA) 2 x 4 MG capsule Take 2 capsules (8 mg total) by mouth daily.  . ondansetron (ZOFRAN) 8 MG tablet Take 1 tablet (8 mg total) by mouth every 8 (eight) hours as needed for nausea or vomiting.  Marland Kitchen oxyCODONE (ROXICODONE) 15 MG immediate release tablet Take 1 tablet (15 mg total) by mouth every 6 (six) hours as needed for pain.  Marland Kitchen Potassium 95 MG TABS Take 1 tablet by mouth daily as needed (Cramping).  . prochlorperazine (COMPAZINE) 10 MG tablet Take 1  tablet (10 mg total) by mouth every 6 (six) hours as needed for nausea or vomiting.   No facility-administered encounter medications on file as of 06/10/2018.     ALLERGIES:  No Known Allergies   PHYSICAL EXAM:  ECOG Performance status: 1  Vitals:   06/10/18 1030  BP: 113/83  Pulse: 78  Resp: 18  Temp: (!) 97.4 F (36.3 C)  SpO2: 97%   Filed Weights   06/10/18 1030  Weight: 168 lb (76.2 kg)    Physical Exam  Constitutional: He is oriented to person, place, and time. He appears well-developed and well-nourished.  Musculoskeletal: Normal range of motion.  Neurological: He is alert and oriented to person, place, and time.  Skin: Skin is warm and dry.  Psychiatric: He has a normal mood and affect. His behavior is normal. Judgment and thought content normal.  Abdomen: Soft nontender with no palpable hepatospleno megaly.  Mild tenderness on the lower part of the sternum.   LABORATORY DATA:  I have reviewed the labs as listed.  CBC    Component Value Date/Time   WBC 3.9 (L) 06/10/2018 0924   RBC 4.68 06/10/2018 0924   HGB 16.3 06/10/2018 0924   HCT 46.1 06/10/2018 0924   PLT 52 (L) 06/10/2018 0924   MCV 98.5 06/10/2018 0924   MCH 34.8 (H) 06/10/2018 0924   MCHC 35.4 06/10/2018 0924   RDW 15.5 06/10/2018 0924   LYMPHSABS 1.5 06/10/2018 0924   MONOABS 0.3 06/10/2018 0924   EOSABS 0.1 06/10/2018 0924   BASOSABS 0.0 06/10/2018 0924   CMP Latest Ref Rng & Units 06/10/2018 05/03/2018 04/15/2018  Glucose 70 - 99 mg/dL 154(H) 185(H) 179(H)  BUN 8 - 23 mg/dL 22 15 14   Creatinine 0.61 - 1.24 mg/dL 1.18 0.87 0.75  Sodium 135 - 145 mmol/L 136 135 133(L)  Potassium 3.5 - 5.1 mmol/L 4.0 4.1 3.9  Chloride 98 - 111 mmol/L 103 105 105  CO2 22 - 32 mmol/L 25 24 21(L)  Calcium 8.9 - 10.3 mg/dL 8.5(L) 8.2(L) 8.3(L)  Total Protein 6.5 - 8.1 g/dL 7.3 7.2 7.7  Total Bilirubin 0.3 - 1.2 mg/dL 2.5(H) 1.3(H) 1.9(H)  Alkaline Phos 38 - 126 U/L 79 91 90  AST 15 - 41 U/L 92(H) 107(H) 114(H)    ALT 0 - 44 U/L 54(H) 79(H) 75(H)         ASSESSMENT & PLAN:   Metastatic hepatocellular carcinoma to bone ([facility])  1.  Metastatic hepatocellular carcinoma to the bones: - PET CT scan dated 12/25/2017 which showed sternal hypermetabolic lesion SUV 6.64, left adrenal mass with SUV 2.97 along with a liver mass. -Biopsy of the sternal lesion on 12/29/2017 shows metastatic hepatocellular carcinoma.  Child's class A with 6 points.  AFP of 33. -MRI of the abdomen on 01/13/2018 shows large 15.4 cm HCC in segment 4A, with nonocclusive tumor extension into suprahepatic IVC.  Smaller 1.6 cm segment 7 right lower lobe LI-RADS 4 with moderate likelihood of HCC and 1.4 cm caudate lobe lesion.  Bilateral adrenal metastasis, left more than right present. - Sternal radiation therapy finished on 02/09/2018, 5 fractions. - Lenvatinib 12 mg daily started on 02/10/2018.  He had developed mouth sores, loss of weight and severe lack of energy.  Lenvatinib was held on 02/23/2018. -Lenvatinib was started back at 4 mg daily for 1 week on 03/08/2018, increased to 8 mg daily on 03/12/2018.  He is tolerating it very well.  He has developed hoarseness of his voice since he started back on lenvatinib.  It comes and goes. - MRI of the abdomen dated 05/03/2018 shows dominant hepatocellular carcinoma slightly decreased in size with a decrease in size of the caudate lesion.  Posterior segment 7 lesion is stable.  No new lesions were reported.  There is slight enhancing sternal lesion which is measuring 3.8 x 3.1 cm, previously 3.1 x 2.1 cm.  This could be radiation related change. - Lost about 7 pounds since last visit.  He reported having vomiting 1 day on Tuesday.  Appetite has been low.  We have suggested him to start drinking boost/Ensure plus every day.  He does have diarrhea after eating. -His LFTs including AST and ALT are stable.  However his total bilirubin went up to 2.5.  We will continue lenvatinib at the same dose level.  I plan to  see him back in 4 weeks for follow-up.  I will also repeat MRI liver with and without contrast.  2.  Sternal pain: He has not taken pain pills since 03/06/2018.  He had mild pain in the sternal area when he moved around.  He reports that the pain has gotten slightly worse in the last few days.  3.  Diarrhea: -He has up to 4-6 loose stools per day. -He takes Imodium 1 tablet twice daily.  I have told him to increase it to 2 tablets in the morning.      Orders placed this encounter:  Orders Placed This Encounter  Procedures  . MR Abdomen W Wo Contrast  . AFP tumor marker  . CBC with Differential/Platelet  . Comprehensive metabolic panel      Derek Jack, MD [place] [phone]

## 2018-06-10 NOTE — Assessment & Plan Note (Signed)
1.  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 -Biopsy of the sternal lesion on 12/29/2017 shows metastatic hepatocellular carcinoma.  Child's class A with 6 points.  AFP of 33. -MRI of the abdomen on 01/13/2018 shows large 15.4 cm HCC in segment 4A, with nonocclusive tumor extension into suprahepatic IVC.  Smaller 1.6 cm segment 7 right lower lobe LI-RADS 4 with moderate likelihood of HCC and 1.4 cm caudate lobe lesion.  Bilateral adrenal metastasis, left more than right present. - Sternal radiation therapy finished on 02/09/2018, 5 fractions. - Lenvatinib 12 mg daily started on 02/10/2018.  He had developed mouth sores, loss of weight and severe lack of energy.  Lenvatinib was held on 02/23/2018. -Lenvatinib was started back at 4 mg daily for 1 week on 03/08/2018, increased to 8 mg daily on 03/12/2018.  He is tolerating it very well.  He has developed hoarseness of his voice since he started back on lenvatinib.  It comes and goes. - MRI of the abdomen dated 05/03/2018 shows dominant hepatocellular carcinoma slightly decreased in size with a decrease in size of the caudate lesion.  Posterior segment 7 lesion is stable.  No new lesions were reported.  There is slight enhancing sternal lesion which is measuring 3.8 x 3.1 cm, previously 3.1 x 2.1 cm.  This could be radiation related change. - Lost about 7 pounds since last visit.  He reported having vomiting 1 day on Tuesday.  Appetite has been low.  We have suggested him to start drinking boost/Ensure plus every day.  He does have diarrhea after eating. -His LFTs including AST and ALT are stable.  However his total bilirubin went up to 2.5.  We will continue lenvatinib at the same dose level.  I plan to see him back in 4 weeks for follow-up.  I will also repeat MRI liver with and without contrast.  2.  Sternal pain: He has  not taken pain pills since 03/06/2018.  He had mild pain in the sternal area when he moved around.  He reports that the pain has gotten slightly worse in the last few days.  3.  Diarrhea: -He has up to 4-6 loose stools per day. -He takes Imodium 1 tablet twice daily.  I have told him to increase it to 2 tablets in the morning.

## 2018-06-11 ENCOUNTER — Other Ambulatory Visit (HOSPITAL_COMMUNITY): Payer: Self-pay | Admitting: *Deleted

## 2018-06-11 MED ORDER — LENVATINIB (8 MG DAILY DOSE) 2 X 4 MG PO CPPK: 8.0000 mg | ORAL_CAPSULE | Freq: Every day | ORAL | 0 refills | Status: DC

## 2018-06-11 NOTE — Telephone Encounter (Signed)
Chart reviewed, per last office note, lenvima refilled.

## 2018-06-18 ENCOUNTER — Telehealth (HOSPITAL_COMMUNITY): Payer: Self-pay

## 2018-06-18 ENCOUNTER — Other Ambulatory Visit (HOSPITAL_COMMUNITY): Payer: Self-pay | Admitting: Nurse Practitioner

## 2018-06-18 DIAGNOSIS — R197 Diarrhea, unspecified: Secondary | ICD-10-CM

## 2018-06-18 MED ORDER — DIPHENOXYLATE-ATROPINE 2.5-0.025 MG PO TABS: 2.0000 | ORAL_TABLET | Freq: Four times a day (QID) | ORAL | 0 refills | Status: DC | PRN

## 2018-06-18 NOTE — Telephone Encounter (Signed)
Nutrition Assessment   Reason for Assessment:   Patient identified on Malnutrition Screening report for weight loss and poor appetite   ASSESSMENT:   62 year old male with hepatocellular carcinoma with mets to the bone.  Past medical history of PAD, High cholesterol Patient currently receiving lenvatinib.    Called patient this am to introduce self and service at the cancer center.  Patient reports "everything I eat goes right through me."  "I have already gone to the bathroom 8 times this am since 2:30am.  Reports has taken 2 imodium in the am as instructed by Dr. Raliegh Ip last visit and it has not helped.  Reports it does not matter what foods he eats, everything goes through him.  Describe stool as watery, light brown in color, not oily.  Also reports that he is drinking 2 oral nutrition supplement shakes and drinking gatorade.  Usually breakfast is cereal or oatmeal, lunch is a sandwich and nibbles the rest of the day.  Reports wife cooked chili beans recently and couldn't really taste them.     Nutrition Focused Physical Exam: unable to perform   Medications: zofran, potassium, compazine, reports taking imodium   Labs: glucose 154   Anthropometrics:   Height: 73 inches Weight: 168 lb UBW: Noted 180 lb 01/07/18 175 lb 8/27  BMI: 22 7% weight loss since 5 months  Estimated Energy Needs  Kcals: 2280-2600 Protein: 114-130 g Fluid: 2.6 L   NUTRITION DIAGNOSIS: Unintentional weight loss related to cancer treatment related side effects as evidenced by diarrhea, 7% weight loss in the last 5 months, taste changes   INTERVENTION:  Discussed diarrhea with RN, Horris Latino and will reach out to patient and Dr. Raliegh Ip for medication adjustments to help relieve symptom Discussed good sources of protein briefly and good nutrition.  Although this likely not to improve until diarrhea better controlled.   Offered contact information and declined at this time. Provided patient availablity of RDs on  Tuesday am and Friday, declined appointment at this time   MONITORING, EVALUATION, GOAL: weight trends, intake   Next Visit: as needed  Naje Rice B. Zenia Resides, [place], [place] Registered Dietitian [phone] (pager)

## 2018-06-18 NOTE — Telephone Encounter (Signed)
Called the patient regarding new prescription for diarrhea.  Patient verbalized understanding.  Instructed the patient to call if the diarrhea continued with understanding verbalized.

## 2018-06-18 NOTE — Telephone Encounter (Signed)
-----   Message from Glennie Isle, NP-C sent at 06/18/2018  2:53 PM EDT ----- Can u call him and tell him I call him in something else   ----- Message ----- From: Penelope Galas, RN Sent: 06/18/2018  11:55 AM EDT To: Glennie Isle, NP-C  The dietitian spoke with the patient for a nutrition assessment and learned he is having diarrhea with his Lenvatinib.  Asked by the dietitian to call the patient to help manage his diarrhea so his nutrition can be managed.    Spoke with the patient who stated he has been having diarrhea since starting the Lenvatinib.  Per the patient he was instructed to take 2 Imodium in the morning with his medication by the oncologist but it is not managing his diarrhea.  The patient has had 8 loose stools since 0230.    Instructed the patient a telephone call will be sent to the oncologist/NP for further management of diarrhea and he will receive a phone call for further instructions.   Tele. No. [phone]

## 2018-06-18 NOTE — Progress Notes (Signed)
lom

## 2018-06-18 NOTE — Telephone Encounter (Signed)
The dietitian spoke with the patient for a nutrition assessment and learned he is having diarrhea with his Lenvatinib.  Asked by the dietitian to call the patient to help manage his diarrhea so his nutrition can be managed.    Spoke with the patient who stated he has been having diarrhea since starting the Lenvatinib.  Per the patient he was instructed to take 2 Imodium in the morning with his medication by the oncologist but it is not managing his diarrhea.  The patient has had 8 loose stools since 0230.    Instructed the patient a telephone call will be sent to the oncologist/NP for further management of diarrhea and he will receive a phone call for further instructions.   Tele. No. [phone]

## 2018-06-28 ENCOUNTER — Other Ambulatory Visit (HOSPITAL_COMMUNITY): Payer: Self-pay | Admitting: Nurse Practitioner

## 2018-06-28 DIAGNOSIS — R197 Diarrhea, unspecified: Secondary | ICD-10-CM

## 2018-06-29 DIAGNOSIS — Z23 Encounter for immunization: Secondary | ICD-10-CM | POA: Diagnosis not present

## 2018-07-08 ENCOUNTER — Inpatient Hospital Stay (HOSPITAL_COMMUNITY): Payer: BLUE CROSS/BLUE SHIELD

## 2018-07-08 ENCOUNTER — Ambulatory Visit (HOSPITAL_COMMUNITY)
Admission: RE | Admit: 2018-07-08 | Discharge: 2018-07-08 | Disposition: A | Discharge status: Home / Self Care | Payer: BLUE CROSS/BLUE SHIELD | Source: Ambulatory Visit | Attending: Nurse Practitioner | Admitting: Nurse Practitioner

## 2018-07-08 DIAGNOSIS — C220 Liver cell carcinoma: Secondary | ICD-10-CM | POA: Insufficient documentation

## 2018-07-08 DIAGNOSIS — C7972 Secondary malignant neoplasm of left adrenal gland: Secondary | ICD-10-CM | POA: Diagnosis not present

## 2018-07-08 DIAGNOSIS — C7971 Secondary malignant neoplasm of right adrenal gland: Secondary | ICD-10-CM | POA: Insufficient documentation

## 2018-07-08 DIAGNOSIS — C7951 Secondary malignant neoplasm of bone: Secondary | ICD-10-CM | POA: Diagnosis present

## 2018-07-08 DIAGNOSIS — R188 Other ascites: Secondary | ICD-10-CM | POA: Insufficient documentation

## 2018-07-08 LAB — CBC WITH DIFFERENTIAL/PLATELET
ABS IMMATURE GRANULOCYTES: 0.01 10*3/uL (ref 0.00–0.07)
BASOS ABS: 0 10*3/uL (ref 0.0–0.1)
Basophils Relative: 0 %
EOS ABS: 0.1 10*3/uL (ref 0.0–0.5)
Eosinophils Relative: 2 %
HEMATOCRIT: 48.6 % (ref 39.0–52.0)
Hemoglobin: 16.4 g/dL (ref 13.0–17.0)
IMMATURE GRANULOCYTES: 0 %
LYMPHS ABS: 1.3 10*3/uL (ref 0.7–4.0)
Lymphocytes Relative: 36 %
MCH: 34.6 pg — ABNORMAL HIGH (ref 26.0–34.0)
MCHC: 33.7 g/dL (ref 30.0–36.0)
MCV: 102.5 fL — AB (ref 80.0–100.0)
MONOS PCT: 13 %
Monocytes Absolute: 0.5 10*3/uL (ref 0.1–1.0)
NEUTROS ABS: 1.8 10*3/uL (ref 1.7–7.7)
NEUTROS PCT: 49 %
NRBC: 0 % (ref 0.0–0.2)
PLATELETS: 57 10*3/uL — AB (ref 150–400)
RBC: 4.74 MIL/uL (ref 4.22–5.81)
RDW: 15.4 % (ref 11.5–15.5)
WBC: 3.7 10*3/uL — ABNORMAL LOW (ref 4.0–10.5)

## 2018-07-08 LAB — COMPREHENSIVE METABOLIC PANEL
ALBUMIN: 2.6 g/dL — AB (ref 3.5–5.0)
ALT: 47 U/L — ABNORMAL HIGH (ref 0–44)
AST: 85 U/L — AB (ref 15–41)
Alkaline Phosphatase: 94 U/L (ref 38–126)
Anion gap: 4 — ABNORMAL LOW (ref 5–15)
BILIRUBIN TOTAL: 1.7 mg/dL — AB (ref 0.3–1.2)
BUN: 14 mg/dL (ref 8–23)
CHLORIDE: 106 mmol/L (ref 98–111)
CO2: 26 mmol/L (ref 22–32)
CREATININE: 0.94 mg/dL (ref 0.61–1.24)
Calcium: 8.4 mg/dL — ABNORMAL LOW (ref 8.9–10.3)
GFR calc Af Amer: >60 mL/min (ref >60)
GLUCOSE: 102 mg/dL — AB (ref 70–99)
POTASSIUM: 4.3 mmol/L (ref 3.5–5.1)
Sodium: 136 mmol/L (ref 135–145)
Total Protein: 7.5 g/dL (ref 6.5–8.1)

## 2018-07-08 MED ORDER — GADOBUTROL 1 MMOL/ML IV SOLN
8.0000 mL | Freq: Once | INTRAVENOUS | Status: AC | PRN
  Administered 2018-07-08: 8 mL via INTRAVENOUS

## 2018-07-09 LAB — AFP TUMOR MARKER: AFP, SERUM, TUMOR MARKER: 65.5 ng/mL — AB (ref 0.0–8.3)

## 2018-07-12 ENCOUNTER — Inpatient Hospital Stay (HOSPITAL_COMMUNITY): Payer: BLUE CROSS/BLUE SHIELD | Attending: Hematology | Admitting: Hematology

## 2018-07-12 ENCOUNTER — Encounter (HOSPITAL_COMMUNITY): Payer: Self-pay | Admitting: Hematology

## 2018-07-12 VITALS — BP 128/85 | HR 74 | Temp 97.5°F | Resp 16 | Wt 168.0 lb

## 2018-07-12 DIAGNOSIS — C7971 Secondary malignant neoplasm of right adrenal gland: Secondary | ICD-10-CM | POA: Diagnosis not present

## 2018-07-12 DIAGNOSIS — C7972 Secondary malignant neoplasm of left adrenal gland: Secondary | ICD-10-CM | POA: Diagnosis not present

## 2018-07-12 DIAGNOSIS — Z87891 Personal history of nicotine dependence: Secondary | ICD-10-CM | POA: Diagnosis not present

## 2018-07-12 DIAGNOSIS — Z79899 Other long term (current) drug therapy: Secondary | ICD-10-CM | POA: Insufficient documentation

## 2018-07-12 DIAGNOSIS — Z7982 Long term (current) use of aspirin: Secondary | ICD-10-CM

## 2018-07-12 DIAGNOSIS — C7951 Secondary malignant neoplasm of bone: Secondary | ICD-10-CM | POA: Insufficient documentation

## 2018-07-12 DIAGNOSIS — C220 Liver cell carcinoma: Secondary | ICD-10-CM | POA: Insufficient documentation

## 2018-07-12 DIAGNOSIS — Z923 Personal history of irradiation: Secondary | ICD-10-CM

## 2018-07-12 DIAGNOSIS — R197 Diarrhea, unspecified: Secondary | ICD-10-CM

## 2018-07-12 MED ORDER — DIPHENOXYLATE-ATROPINE 2.5-0.025 MG PO TABS: ORAL_TABLET | ORAL | 5 refills | Status: DC

## 2018-07-12 NOTE — Progress Notes (Signed)
[name], [place] [address]   CLINIC:  Medical Oncology/Hematology  PCP:  Patient, No Pcp Per No address on file None   REASON FOR VISIT: Follow-up for metastatic hepatocellular carcinoma to the bones  CURRENT THERAPY: Lenvatinib   INTERVAL HISTORY:  [name] 62 y.o. 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 Patient is here today with his wife. Patient is still having diarrhea daily. He needs a refill on his lomotil. He has been fasting on and off for a day or so to clean out his system. He will start eating so he doesn't loose anymore weight. Patient denies any skin rashes. Denies any nausea or vomiting. Patient reports his appetite is decreased to 75%. His energy level is 50%.     REVIEW OF SYSTEMS:  Review of Systems  Gastrointestinal: Positive for diarrhea.  All other systems reviewed and are negative.    PAST MEDICAL/SURGICAL HISTORY:  Past Medical History:  Diagnosis Date  . High cholesterol   . PAD (peripheral artery disease) ([place])    Past Surgical History:  Procedure Laterality Date  . BACK SURGERY    . IR RADIOLOGIST EVAL & MGMT  01/12/2018     SOCIAL HISTORY:  Social History   Socioeconomic History  . Marital status: Married    Spouse name: Not on file  . Number of children: Not on file  . Years of education: Not on file  . Highest education level: Not on file  Occupational History  . Not on file  Social Needs  . Financial resource strain: Not on file  . Food insecurity:    Worry: Not on file    Inability: Not on file  . Transportation needs:    Medical: Not on file    Non-medical: Not on file  Tobacco Use  . Smoking status: Former Smoker    Packs/day: 2.00    Years: 40.00    Pack years: 80.00    Types: Cigarettes    Last attempt to quit: 12/09/2012    Years since quitting: 5.5  . Smokeless tobacco: Never Used  Substance and Sexual Activity  . Alcohol  use: Yes    Alcohol/week: 15.0 standard drinks    Types: 15 Cans of beer per week    Comment: 6 3times a week  . Drug use: Never  . Sexual activity: Not on file  Lifestyle  . Physical activity:    Days per week: Not on file    Minutes per session: Not on file  . Stress: Not on file  Relationships  . Social connections:    Talks on phone: Not on file    Gets together: Not on file    Attends religious service: Not on file    Active member of club or organization: Not on file    Attends meetings of clubs or organizations: Not on file    Relationship status: Not on file  . Intimate partner violence:    Fear of current or ex partner: Not on file    Emotionally abused: Not on file    Physically abused: Not on file    Forced sexual activity: Not on file  Other Topics Concern  . Not on file  Social History Narrative  . Not on file    FAMILY HISTORY:  Family History  Problem Relation Age of Onset  . Alzheimer's disease Mother   . Cancer Sister  breast    CURRENT MEDICATIONS:  Outpatient Encounter Medications as of 07/12/2018  Medication Sig  . atorvastatin (LIPITOR) 40 MG tablet Take 40 mg by mouth daily.  . clopidogrel (PLAVIX) 75 MG tablet Take 75 mg by mouth daily.  . diclofenac sodium (VOLTAREN) 1 % GEL Apply 1 application topically 3 (three) times daily.  . diphenoxylate-atropine (LOMOTIL) 2.5-0.025 MG tablet TAKE 2 TABLET BY MOUTH FOUR TIMES DAILY AS NEEDED FOR DIARRHEA  . lenvatinib 8 mg daily dose (LENVIMA) 2 x 4 MG capsule Take 2 capsules (8 mg total) by mouth daily.  . ondansetron (ZOFRAN) 8 MG tablet Take 1 tablet (8 mg total) by mouth every 8 (eight) hours as needed for nausea or vomiting.  Marland Kitchen oxyCODONE (ROXICODONE) 15 MG immediate release tablet Take 1 tablet (15 mg total) by mouth every 6 (six) hours as needed for pain.  Marland Kitchen Potassium 95 MG TABS Take 1 tablet by mouth daily as needed (Cramping).  . prochlorperazine (COMPAZINE) 10 MG tablet Take 1 tablet (10 mg  total) by mouth every 6 (six) hours as needed for nausea or vomiting.  . [DISCONTINUED] aspirin EC 81 MG tablet Take 81 mg by mouth daily.  . [DISCONTINUED] diphenoxylate-atropine (LOMOTIL) 2.5-0.025 MG tablet TAKE 2 TABLET BY MOUTH FOUR TIMES DAILY AS NEEDED FOR DIARRHEA   No facility-administered encounter medications on file as of 07/12/2018.     ALLERGIES:  No Known Allergies   PHYSICAL EXAM:  ECOG Performance status: 1  Vitals:   07/12/18 1444  BP: 128/85  Pulse: 74  Resp: 16  Temp: (!) 97.5 F (36.4 C)  SpO2: 96%   Filed Weights   07/12/18 1444  Weight: 168 lb (76.2 kg)    Physical Exam  Constitutional: He is oriented to person, place, and time. He appears well-developed and well-nourished.  Abdominal: Soft.  Musculoskeletal: Normal range of motion.  Neurological: He is alert and oriented to person, place, and time.  Skin: Skin is warm and dry.  Psychiatric: He has a normal mood and affect. His behavior is normal. Judgment and thought content normal.  Abdomen is soft nontender with no palpable hepatospleno megaly. Extremities: No edema or cyanosis.   LABORATORY DATA:  I have reviewed the labs as listed.  CBC    Component Value Date/Time   WBC 3.7 (L) 07/08/2018 1022   RBC 4.74 07/08/2018 1022   HGB 16.4 07/08/2018 1022   HCT 48.6 07/08/2018 1022   PLT 57 (L) 07/08/2018 1022   MCV 102.5 (H) 07/08/2018 1022   MCH 34.6 (H) 07/08/2018 1022   MCHC 33.7 07/08/2018 1022   RDW 15.4 07/08/2018 1022   LYMPHSABS 1.3 07/08/2018 1022   MONOABS 0.5 07/08/2018 1022   EOSABS 0.1 07/08/2018 1022   BASOSABS 0.0 07/08/2018 1022   CMP Latest Ref Rng & Units 07/08/2018 06/10/2018 05/03/2018  Glucose 70 - 99 mg/dL 102(H) 154(H) 185(H)  BUN 8 - 23 mg/dL 14 22 15   Creatinine 0.61 - 1.24 mg/dL 0.94 1.18 0.87  Sodium 135 - 145 mmol/L 136 136 135  Potassium 3.5 - 5.1 mmol/L 4.3 4.0 4.1  Chloride 98 - 111 mmol/L 106 103 105  CO2 22 - 32 mmol/L 26 25 24   Calcium 8.9 - 10.3  mg/dL 8.4(L) 8.5(L) 8.2(L)  Total Protein 6.5 - 8.1 g/dL 7.5 7.3 7.2  Total Bilirubin 0.3 - 1.2 mg/dL 1.7(H) 2.5(H) 1.3(H)  Alkaline Phos 38 - 126 U/L 94 79 91  AST 15 - 41 U/L 85(H) 92(H) 107(H)  ALT  0 - 44 U/L 47(H) 54(H) 79(H)       DIAGNOSTIC IMAGING:  I have independently reviewed images of the MRI and discussed with the patient and his wife.      ASSESSMENT & PLAN:   Metastatic hepatocellular carcinoma to bone ([place]) 1.  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 -Biopsy of the sternal lesion on 12/29/2017 shows metastatic hepatocellular carcinoma.  Child's class A with 6 points.  AFP of 33. -MRI of the abdomen on 01/13/2018 shows large 15.4 cm HCC in segment 4A, with nonocclusive tumor extension into suprahepatic IVC.  Smaller 1.6 cm segment 7 right lower lobe LI-RADS 4 with moderate likelihood of HCC and 1.4 cm caudate lobe lesion.  Bilateral adrenal metastasis, left more than right present. - Sternal radiation therapy finished on 02/09/2018, 5 fractions. - Lenvatinib 12 mg daily started on 02/10/2018.  He had developed mouth sores, loss of weight and severe lack of energy.  Lenvatinib was held on 02/23/2018. -Lenvatinib was started back at 4 mg daily for 1 week on 03/08/2018, increased to 8 mg daily on 03/12/2018.  He is tolerating it very well.  He has developed hoarseness of his voice since he started back on lenvatinib.  It comes and goes. - We reviewed the results of the MRI of the abdomen dated 07/08/2018 which shows dominant hepatocellular carcinoma demonstrating mild decrease in size from the previous exam.  Bilateral adrenal meta stasis also demonstrate improvement in size.  Increase in volume of perihepatic ascites.  Sternal metastasis appears slightly increased in size. - We have reviewed his blood work.  Latest AFP was 65.  We will  continue to monitor it closely.  His bilirubin and other liver enzymes have improved slightly. -His weight has been stable over the last 4 weeks.  Prior to that he lost about 7 pounds.  He does not have good taste or appetite.  We will closely monitor it.  2.  Sternal pain: He finished radiation therapy to the sternum on 02/09/2018 in [place].  He has on and off sternal pain on certain movements.  He is not requiring any pain medication on a regular basis.  3.  Diarrhea: -He usually has up to 4-6 loose stools per day.  For the past 2 to 3 days, he did not have any diarrhea. - He will use Lomotil as needed for his diarrhea.      Orders placed this encounter:  Orders Placed This Encounter  Procedures  . CBC with Differential/Platelet  . Comprehensive metabolic panel  . AFP tumor marker      Derek Jack, MD [place] [phone]

## 2018-07-12 NOTE — Patient Instructions (Addendum)
[place] at [facility]  Discharge Instructions: Follow up in 1 months with labs    _______________________________________________________________  Thank you for choosing [facility] at [facility] to provide your oncology and hematology care.  To afford each patient quality time with our providers, please arrive at least 15 minutes before your scheduled appointment.  You need to re-schedule your appointment if you arrive 10 or more minutes late.  We strive to give you quality time with our providers, and arriving late affects you and other patients whose appointments are after yours.  Also, if you no show three or more times for appointments you may be dismissed from the clinic.  Again, thank you for choosing [place] at [place] hope is that these requests will allow you access to exceptional care and in a timely manner. _______________________________________________________________  If you have questions after your visit, please contact our office at [phone] between the hours of 8:30 a.m. and 5:00 p.m. Voicemails left after 4:30 p.m. will not be returned until the following business day. _______________________________________________________________  For prescription refill requests, have your pharmacy contact our office. _______________________________________________________________  Recommendations made by the consultant and any test results will be sent to your referring physician. _______________________________________________________________

## 2018-07-12 NOTE — Assessment & Plan Note (Signed)
1.  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 -Biopsy of the sternal lesion on 12/29/2017 shows metastatic hepatocellular carcinoma.  Child's class A with 6 points.  AFP of 33. -MRI of the abdomen on 01/13/2018 shows large 15.4 cm HCC in segment 4A, with nonocclusive tumor extension into suprahepatic IVC.  Smaller 1.6 cm segment 7 right lower lobe LI-RADS 4 with moderate likelihood of HCC and 1.4 cm caudate lobe lesion.  Bilateral adrenal metastasis, left more than right present. - Sternal radiation therapy finished on 02/09/2018, 5 fractions. - Lenvatinib 12 mg daily started on 02/10/2018.  He had developed mouth sores, loss of weight and severe lack of energy.  Lenvatinib was held on 02/23/2018. -Lenvatinib was started back at 4 mg daily for 1 week on 03/08/2018, increased to 8 mg daily on 03/12/2018.  He is tolerating it very well.  He has developed hoarseness of his voice since he started back on lenvatinib.  It comes and goes. - We reviewed the results of the MRI of the abdomen dated 07/08/2018 which shows dominant hepatocellular carcinoma demonstrating mild decrease in size from the previous exam.  Bilateral adrenal meta stasis also demonstrate improvement in size.  Increase in volume of perihepatic ascites.  Sternal metastasis appears slightly increased in size. - We have reviewed his blood work.  Latest AFP was 65.  We will continue to monitor it closely.  His bilirubin and other liver enzymes have improved slightly. -His weight has been stable over the last 4 weeks.  Prior to that he lost about 7 pounds.  He does not have good taste or appetite.  We will closely monitor it.  2.  Sternal pain: He finished radiation therapy to the sternum on 02/09/2018 in [place].  He has on and off sternal pain on certain movements.  He is not requiring any pain medication on a  regular basis.  3.  Diarrhea: -He usually has up to 4-6 loose stools per day.  For the past 2 to 3 days, he did not have any diarrhea. - He will use Lomotil as needed for his diarrhea.

## 2018-07-16 ENCOUNTER — Other Ambulatory Visit (HOSPITAL_COMMUNITY): Payer: Self-pay | Admitting: *Deleted

## 2018-07-16 MED ORDER — LENVATINIB (8 MG DAILY DOSE) 2 X 4 MG PO CPPK: 8.0000 mg | ORAL_CAPSULE | Freq: Every day | ORAL | 0 refills | Status: DC

## 2018-07-16 NOTE — Telephone Encounter (Signed)
Chart reviewed, lenvima refilled.

## 2018-08-13 ENCOUNTER — Inpatient Hospital Stay (HOSPITAL_COMMUNITY): Payer: BLUE CROSS/BLUE SHIELD | Attending: Hematology

## 2018-08-13 DIAGNOSIS — C220 Liver cell carcinoma: Secondary | ICD-10-CM | POA: Insufficient documentation

## 2018-08-13 DIAGNOSIS — C7951 Secondary malignant neoplasm of bone: Secondary | ICD-10-CM | POA: Insufficient documentation

## 2018-08-13 LAB — COMPREHENSIVE METABOLIC PANEL
ALK PHOS: 90 U/L (ref 38–126)
ALT: 47 U/L — ABNORMAL HIGH (ref 0–44)
AST: 76 U/L — ABNORMAL HIGH (ref 15–41)
Albumin: 2.6 g/dL — ABNORMAL LOW (ref 3.5–5.0)
Anion gap: 5 (ref 5–15)
BUN: 17 mg/dL (ref 8–23)
CALCIUM: 8.3 mg/dL — AB (ref 8.9–10.3)
CO2: 26 mmol/L (ref 22–32)
Chloride: 105 mmol/L (ref 98–111)
Creatinine, Ser: 1.03 mg/dL (ref 0.61–1.24)
GFR calc non Af Amer: >60 mL/min (ref >60)
Glucose, Bld: 114 mg/dL — ABNORMAL HIGH (ref 70–99)
Potassium: 4.8 mmol/L (ref 3.5–5.1)
SODIUM: 136 mmol/L (ref 135–145)
Total Bilirubin: 2.3 mg/dL — ABNORMAL HIGH (ref 0.3–1.2)
Total Protein: 7.4 g/dL (ref 6.5–8.1)

## 2018-08-13 LAB — CBC WITH DIFFERENTIAL/PLATELET
Abs Immature Granulocytes: 0 10*3/uL (ref 0.00–0.07)
BASOS PCT: 0 %
Basophils Absolute: 0 10*3/uL (ref 0.0–0.1)
EOS PCT: 2 %
Eosinophils Absolute: 0.1 10*3/uL (ref 0.0–0.5)
HCT: 52.4 % — ABNORMAL HIGH (ref 39.0–52.0)
HEMOGLOBIN: 17.8 g/dL — AB (ref 13.0–17.0)
Immature Granulocytes: 0 %
Lymphocytes Relative: 41 %
Lymphs Abs: 1.5 10*3/uL (ref 0.7–4.0)
MCH: 34.5 pg — AB (ref 26.0–34.0)
MCHC: 34 g/dL (ref 30.0–36.0)
MCV: 101.6 fL — ABNORMAL HIGH (ref 80.0–100.0)
MONO ABS: 0.4 10*3/uL (ref 0.1–1.0)
Monocytes Relative: 11 %
Neutro Abs: 1.7 10*3/uL (ref 1.7–7.7)
Neutrophils Relative %: 46 %
PLATELETS: 53 10*3/uL — AB (ref 150–400)
RBC: 5.16 MIL/uL (ref 4.22–5.81)
RDW: 14.3 % (ref 11.5–15.5)
WBC: 3.6 10*3/uL — AB (ref 4.0–10.5)
nRBC: 0 % (ref 0.0–0.2)

## 2018-08-14 LAB — AFP TUMOR MARKER: AFP, Serum, Tumor Marker: 78 ng/mL — ABNORMAL HIGH (ref 0.0–8.3)

## 2018-08-16 ENCOUNTER — Other Ambulatory Visit (HOSPITAL_COMMUNITY): Payer: Self-pay | Admitting: *Deleted

## 2018-08-16 MED ORDER — LENVATINIB (8 MG DAILY DOSE) 2 X 4 MG PO CPPK: 8.0000 mg | ORAL_CAPSULE | Freq: Every day | ORAL | 0 refills | Status: DC

## 2018-08-16 NOTE — Telephone Encounter (Signed)
Chart reviewed, lenvima refilled.

## 2018-08-17 ENCOUNTER — Other Ambulatory Visit (HOSPITAL_COMMUNITY): Payer: Self-pay | Admitting: *Deleted

## 2018-08-19 ENCOUNTER — Inpatient Hospital Stay (HOSPITAL_COMMUNITY): Payer: BLUE CROSS/BLUE SHIELD | Attending: Hematology | Admitting: Hematology

## 2018-08-19 ENCOUNTER — Encounter (HOSPITAL_COMMUNITY): Payer: Self-pay | Admitting: Hematology

## 2018-08-19 VITALS — BP 134/92 | HR 92 | Temp 97.7°F | Resp 18 | Wt 172.0 lb

## 2018-08-19 DIAGNOSIS — D696 Thrombocytopenia, unspecified: Secondary | ICD-10-CM | POA: Diagnosis not present

## 2018-08-19 DIAGNOSIS — C220 Liver cell carcinoma: Secondary | ICD-10-CM | POA: Insufficient documentation

## 2018-08-19 DIAGNOSIS — C7971 Secondary malignant neoplasm of right adrenal gland: Secondary | ICD-10-CM | POA: Insufficient documentation

## 2018-08-19 DIAGNOSIS — Z79899 Other long term (current) drug therapy: Secondary | ICD-10-CM | POA: Insufficient documentation

## 2018-08-19 DIAGNOSIS — R188 Other ascites: Secondary | ICD-10-CM | POA: Diagnosis not present

## 2018-08-19 DIAGNOSIS — R490 Dysphonia: Secondary | ICD-10-CM | POA: Insufficient documentation

## 2018-08-19 DIAGNOSIS — C7972 Secondary malignant neoplasm of left adrenal gland: Secondary | ICD-10-CM | POA: Insufficient documentation

## 2018-08-19 DIAGNOSIS — Z923 Personal history of irradiation: Secondary | ICD-10-CM

## 2018-08-19 DIAGNOSIS — Z87891 Personal history of nicotine dependence: Secondary | ICD-10-CM | POA: Diagnosis not present

## 2018-08-19 DIAGNOSIS — C7951 Secondary malignant neoplasm of bone: Secondary | ICD-10-CM | POA: Insufficient documentation

## 2018-08-19 DIAGNOSIS — R197 Diarrhea, unspecified: Secondary | ICD-10-CM | POA: Diagnosis not present

## 2018-08-19 DIAGNOSIS — R109 Unspecified abdominal pain: Secondary | ICD-10-CM | POA: Diagnosis not present

## 2018-08-19 DIAGNOSIS — E7800 Pure hypercholesterolemia, unspecified: Secondary | ICD-10-CM | POA: Diagnosis not present

## 2018-08-19 DIAGNOSIS — R140 Abdominal distension (gaseous): Secondary | ICD-10-CM | POA: Insufficient documentation

## 2018-08-19 NOTE — Progress Notes (Signed)
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 [name] is here today with [name] wife. [name] is having increased abdominal pain. [name] is unable to eat meals due to the taste of food. [name] snacks throughout the day. [name] reports [name] diarrhea is a little better than before. [name] denies any bleeding or easy bruising. Denies any fevers or recent infections. Denies any nausea or vomiting. [name] reports [name] appetite and energy level is 50%. [name] is stays cold all the time and doesn't do many activities outside the house. [name] is maintaining [name] weight at this time.     REVIEW OF SYSTEMS:  Review of Systems  Constitutional: Positive for appetite change.  Gastrointestinal: Positive for abdominal distention and abdominal pain.  All other systems reviewed and are negative.    PAST MEDICAL/SURGICAL HISTORY:  Past Medical History:  Diagnosis Date  . High cholesterol   . PAD (peripheral artery disease) ([place])    Past Surgical History:  Procedure Laterality Date  . BACK SURGERY    . IR RADIOLOGIST EVAL & MGMT  01/12/2018     SOCIAL HISTORY:  Social History   Socioeconomic History  . Marital status: Married    Spouse name: Not on file  . Number of children: Not on file  . Years of education: Not on file  . Highest education level: Not on file  Occupational History  . Not on file  Social Needs  . Financial resource strain: Not on file  . Food insecurity:    Worry: Not on file    Inability: Not on file  . Transportation needs:    Medical: Not on file    Non-medical: Not on file  Tobacco Use  . Smoking status: Former Smoker    Packs/day: 2.00   Years: 40.00    Pack years: 80.00    Types: Cigarettes    Last attempt to quit: 12/09/2012    Years since quitting: 5.6  . Smokeless tobacco: Never Used  Substance and Sexual Activity  . Alcohol use: Yes    Alcohol/week: 15.0 standard drinks    Types: 15 Cans of beer per week    Comment: 6 3times a week  . Drug use: Never  . Sexual activity: Not on file  Lifestyle  . Physical activity:    Days per week: Not on file    Minutes per session: Not on file  . Stress: Not on file  Relationships  . Social connections:    Talks on phone: Not on file    Gets together: Not on file    Attends religious service: Not on file    Active member of club or organization: Not on file    Attends meetings of clubs or organizations: Not on file    Relationship status: Not on file  . Intimate partner violence:    Fear of current or ex partner: Not on file    Emotionally abused: Not on file    Physically abused: Not on file    Forced sexual activity: Not on file  Other Topics Concern  . Not on file  Social History Narrative  . Not on file  FAMILY HISTORY:  Family History  Problem Relation Age of Onset  . Alzheimer's disease Mother   . Cancer Sister        breast    CURRENT MEDICATIONS:  Outpatient Encounter Medications as of 08/19/2018  Medication Sig  . atorvastatin (LIPITOR) 40 MG tablet Take 40 mg by mouth daily.  . clopidogrel (PLAVIX) 75 MG tablet Take 75 mg by mouth daily.  . diclofenac sodium (VOLTAREN) 1 % GEL Apply 1 application topically 3 (three) times daily.  . diphenoxylate-atropine (LOMOTIL) 2.5-0.025 MG tablet TAKE 2 TABLET BY MOUTH FOUR TIMES DAILY AS NEEDED FOR DIARRHEA  . lenvatinib 8 mg daily dose (LENVIMA) 2 x 4 MG capsule Take 2 capsules (8 mg total) by mouth daily.  . ondansetron (ZOFRAN) 8 MG tablet Take 1 tablet (8 mg total) by mouth every 8 (eight) hours as needed for nausea or vomiting.  Marland Kitchen oxyCODONE (ROXICODONE) 15 MG immediate release tablet Take 1 tablet (15  mg total) by mouth every 6 (six) hours as needed for pain.  Marland Kitchen Potassium 95 MG TABS Take 1 tablet by mouth daily as needed (Cramping).  . prochlorperazine (COMPAZINE) 10 MG tablet Take 1 tablet (10 mg total) by mouth every 6 (six) hours as needed for nausea or vomiting.   No facility-administered encounter medications on file as of 08/19/2018.     ALLERGIES:  No Known Allergies   PHYSICAL EXAM:  ECOG Performance status: 1  Vitals:   08/19/18 0820  BP: (!) 134/92  Pulse: 92  Resp: 18  Temp: 97.7 F (36.5 C)  SpO2: 95%   Filed Weights   08/19/18 0820  Weight: 172 lb (78 kg)    Physical Exam Constitutional:      Appearance: Normal appearance. [name] is normal weight.  Abdominal:     General: There is distension.     Palpations: Abdomen is soft.  Musculoskeletal: Normal range of motion.  Skin:    General: Skin is warm and dry.  Neurological:     Mental Status: [name] is alert and oriented to person, place, and time. Mental status is at baseline.  Psychiatric:        Mood and Affect: Mood normal.        Behavior: Behavior normal.        Thought Content: Thought content normal.        Judgment: Judgment normal.      LABORATORY DATA:  I have reviewed the labs as listed.  CBC    Component Value Date/Time   WBC 3.6 (L) 08/13/2018 1008   RBC 5.16 08/13/2018 1008   HGB 17.8 (H) 08/13/2018 1008   HCT 52.4 (H) 08/13/2018 1008   PLT 53 (L) 08/13/2018 1008   MCV 101.6 (H) 08/13/2018 1008   MCH 34.5 (H) 08/13/2018 1008   MCHC 34.0 08/13/2018 1008   RDW 14.3 08/13/2018 1008   LYMPHSABS 1.5 08/13/2018 1008   MONOABS 0.4 08/13/2018 1008   EOSABS 0.1 08/13/2018 1008   BASOSABS 0.0 08/13/2018 1008   CMP Latest Ref Rng & Units 08/13/2018 07/08/2018 06/10/2018  Glucose 70 - 99 mg/dL 114(H) 102(H) 154(H)  BUN 8 - 23 mg/dL 17 14 22   Creatinine 0.61 - 1.24 mg/dL 1.03 0.94 1.18  Sodium 135 - 145 mmol/L 136 136 136  Potassium 3.5 - 5.1 mmol/L 4.8 4.3 4.0  Chloride 98 - 111 mmol/L  105 106 103  CO2 22 - 32 mmol/L 26 26 25   Calcium 8.9 - 10.3 mg/dL 8.3(L) 8.4(L) 8.5(L)  Total Protein 6.5 - 8.1 g/dL 7.4 7.5 7.3  Total Bilirubin 0.3 - 1.2 mg/dL 2.3(H) 1.7(H) 2.5(H)  Alkaline Phos 38 - 126 U/L 90 94 79  AST 15 - 41 U/L 76(H) 85(H) 92(H)  ALT 0 - 44 U/L 47(H) 47(H) 54(H)      I have reviewed Francene Finders, NP's note and agree with the documentation.  I personally performed a face-to-face visit, made revisions and my assessment and plan is as follows.      ASSESSMENT & PLAN:   Metastatic hepatocellular carcinoma to bone ([place]) 1.  Metastatic hepatocellular carcinoma to the bones: - PET CT scan dated 12/25/2017 which showed sternal hypermetabolic lesion SUV 2.94, left adrenal mass with SUV 2.97 along with a liver mass. -Biopsy of the sternal lesion on 12/29/2017 shows metastatic hepatocellular carcinoma.  Child's class A with 6 points.  AFP of 33. -MRI of the abdomen on 01/13/2018 shows large 15.4 cm HCC in segment 4A, with nonocclusive tumor extension into suprahepatic IVC.  Smaller 1.6 cm segment 7 right lower lobe LI-RADS 4 with moderate likelihood of HCC and 1.4 cm caudate lobe lesion.  Bilateral adrenal metastasis, left more than right present. - Sternal radiation therapy finished on 02/09/2018, 5 fractions. - Lenvatinib 12 mg daily started on 02/10/2018.  [name] had developed mouth sores, loss of weight and severe lack of energy.  Lenvatinib was held on 02/23/2018. -Lenvatinib was started back at 4 mg daily for 1 week on 03/08/2018, increased to 8 mg daily on 03/12/2018.  [name] is tolerating it very well.  [name] has developed hoarseness of [name] voice since [name] started back on lenvatinib.  It comes and goes. - MRI of the abdomen on 07/08/2018 shows dominant hepatocellular carcinoma, demonstrating mild decrease in size from prior exam.  Bilateral adrenal metastasis also improved.  Increase in volume of perihepatic ascites.  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 - We reviewed blood  work today.  AFP went up slightly to 75.  Total bilirubin is 2.3.  Other liver enzymes have improved. -Thrombocytopenia was stable around 50. - [name] complains of having pain in the lower quadrant after eating or drinking which started about 10 days ago.  [name] has not lost any weight.  [name] abdomen shows mild distention consistent with ascites. -I have asked him to try some Maalox or Mylanta to see if it helps.  If not [name] was told to call us back. - I have recommended repeating MRI of the abdomen in 4 weeks.  We will also do a CT of the chest with contrast.  2.  Sternal pain: -Status post XRT to the sternum on 02/09/2018 and then will. -[name] reports slight worsening of the sternal pain on and off.  [name] is not requiring any pain medicine.   3.  Diarrhea: -Diarrhea has improved lately.  [name] is not using Lomotil on a regular basis.      Orders placed this encounter:  Orders Placed This Encounter  Procedures  . MR Abdomen W Wo Contrast  . CT Chest W Contrast  . AFP tumor marker  . Magnesium  . CBC with Differential/Platelet  . Comprehensive metabolic panel      Derek Jack, MD [place] [phone]

## 2018-08-19 NOTE — Patient Instructions (Signed)
[facility] at [facility] Discharge Instructions  Follow up in 4 weeks with labs and scans    Thank you for choosing [facility] at [facility] to provide your oncology and hematology care.  To afford each patient quality time with our provider, please arrive at least 15 minutes before your scheduled appointment time.   If you have a lab appointment with the Cancer Center please come in thru the  Main Entrance and check in at the main information desk  You need to re-schedule your appointment should you arrive 10 or more minutes late.  We strive to give you quality time with our providers, and arriving late affects you and other patients whose appointments are after yours.  Also, if you no show three or more times for appointments you may be dismissed from the clinic at the providers discretion.     Again, thank you for choosing [facility].  Our hope is that these requests will decrease the amount of time that you wait before being seen by our physicians.       _____________________________________________________________  Should you have questions after your visit to  Cancer Center, please contact our office at [phone] between the hours of 8:00 a.m. and 4:30 p.m.  Voicemails left after 4:00 p.m. will not be returned until the following business day.  For prescription refill requests, have your pharmacy contact our office and allow 72 hours.    Cancer Center Support Programs:   > Cancer Support Group  2nd Tuesday of the month 1pm-2pm, [facility]    

## 2018-08-19 NOTE — Assessment & Plan Note (Signed)
1.  Metastatic hepatocellular carcinoma to the bones: - PET CT scan dated 12/25/2017 which showed sternal hypermetabolic lesion SUV 6.73, left adrenal mass with SUV 2.97 along with a liver mass. -Biopsy of the sternal lesion on 12/29/2017 shows metastatic hepatocellular carcinoma.  Child's class A with 6 points.  AFP of 33. -MRI of the abdomen on 01/13/2018 shows large 15.4 cm HCC in segment 4A, with nonocclusive tumor extension into suprahepatic IVC.  Smaller 1.6 cm segment 7 right lower lobe LI-RADS 4 with moderate likelihood of HCC and 1.4 cm caudate lobe lesion.  Bilateral adrenal metastasis, left more than right present. - Sternal radiation therapy finished on 02/09/2018, 5 fractions. - Lenvatinib 12 mg daily started on 02/10/2018.  He had developed mouth sores, loss of weight and severe lack of energy.  Lenvatinib was held on 02/23/2018. -Lenvatinib was started back at 4 mg daily for 1 week on 03/08/2018, increased to 8 mg daily on 03/12/2018.  He is tolerating it very well.  He has developed hoarseness of his voice since he started back on lenvatinib.  It comes and goes. - MRI of the abdomen on 07/08/2018 shows dominant hepatocellular carcinoma, demonstrating mild decrease in size from prior exam.  Bilateral adrenal metastasis also improved.  Increase in volume of perihepatic ascites.  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 - We reviewed blood work today.  AFP went up slightly to 75.  Total bilirubin is 2.3.  Other liver enzymes have improved. -Thrombocytopenia was stable around 50. - He complains of having pain in the lower quadrant after eating or drinking which started about 10 days ago.  He has not lost any weight.  His abdomen shows mild distention consistent with ascites. -I have asked him to try some Maalox or Mylanta to see if it helps.  If not he was told to call us back. - I have recommended repeating MRI of the abdomen in 4 weeks.  We will also do a CT of the chest with contrast.  2.   Sternal pain: -Status post XRT to the sternum on 02/09/2018 and then will. -He reports slight worsening of the sternal pain on and off.  He is not requiring any pain medicine.   3.  Diarrhea: -Diarrhea has improved lately.  He is not using Lomotil on a regular basis.

## 2018-09-14 ENCOUNTER — Other Ambulatory Visit (HOSPITAL_COMMUNITY): Payer: Self-pay | Admitting: *Deleted

## 2018-09-14 MED ORDER — LENVATINIB (8 MG DAILY DOSE) 2 X 4 MG PO CPPK: 8.0000 mg | ORAL_CAPSULE | Freq: Every day | ORAL | 0 refills | Status: DC

## 2018-09-14 NOTE — Telephone Encounter (Signed)
Chart reviewed, lenvima refilled.

## 2018-09-16 ENCOUNTER — Ambulatory Visit (HOSPITAL_COMMUNITY)
Admission: RE | Admit: 2018-09-16 | Discharge: 2018-09-16 | Disposition: A | Discharge status: Home / Self Care | Payer: BLUE CROSS/BLUE SHIELD | Source: Ambulatory Visit | Attending: Nurse Practitioner | Admitting: Nurse Practitioner

## 2018-09-16 ENCOUNTER — Inpatient Hospital Stay (HOSPITAL_COMMUNITY): Payer: BLUE CROSS/BLUE SHIELD | Attending: Hematology

## 2018-09-16 DIAGNOSIS — C220 Liver cell carcinoma: Secondary | ICD-10-CM | POA: Insufficient documentation

## 2018-09-16 DIAGNOSIS — C7972 Secondary malignant neoplasm of left adrenal gland: Secondary | ICD-10-CM | POA: Insufficient documentation

## 2018-09-16 DIAGNOSIS — C7951 Secondary malignant neoplasm of bone: Secondary | ICD-10-CM | POA: Insufficient documentation

## 2018-09-16 DIAGNOSIS — R188 Other ascites: Secondary | ICD-10-CM | POA: Diagnosis not present

## 2018-09-16 DIAGNOSIS — Z923 Personal history of irradiation: Secondary | ICD-10-CM | POA: Insufficient documentation

## 2018-09-16 DIAGNOSIS — C7971 Secondary malignant neoplasm of right adrenal gland: Secondary | ICD-10-CM | POA: Insufficient documentation

## 2018-09-16 DIAGNOSIS — Z79899 Other long term (current) drug therapy: Secondary | ICD-10-CM | POA: Insufficient documentation

## 2018-09-16 DIAGNOSIS — Z87891 Personal history of nicotine dependence: Secondary | ICD-10-CM | POA: Diagnosis not present

## 2018-09-16 DIAGNOSIS — R197 Diarrhea, unspecified: Secondary | ICD-10-CM | POA: Insufficient documentation

## 2018-09-16 LAB — CBC WITH DIFFERENTIAL/PLATELET
ABS IMMATURE GRANULOCYTES: 0.01 10*3/uL (ref 0.00–0.07)
BASOS ABS: 0 10*3/uL (ref 0.0–0.1)
BASOS PCT: 0 %
EOS ABS: 0 10*3/uL (ref 0.0–0.5)
Eosinophils Relative: 1 %
HCT: 50.8 % (ref 39.0–52.0)
Hemoglobin: 17.5 g/dL — ABNORMAL HIGH (ref 13.0–17.0)
IMMATURE GRANULOCYTES: 0 %
Lymphocytes Relative: 34 %
Lymphs Abs: 1.3 10*3/uL (ref 0.7–4.0)
MCH: 35.1 pg — ABNORMAL HIGH (ref 26.0–34.0)
MCHC: 34.4 g/dL (ref 30.0–36.0)
MCV: 101.8 fL — ABNORMAL HIGH (ref 80.0–100.0)
Monocytes Absolute: 0.4 10*3/uL (ref 0.1–1.0)
Monocytes Relative: 11 %
NEUTROS ABS: 2 10*3/uL (ref 1.7–7.7)
NEUTROS PCT: 54 %
NRBC: 0 % (ref 0.0–0.2)
PLATELETS: 56 10*3/uL — AB (ref 150–400)
RBC: 4.99 MIL/uL (ref 4.22–5.81)
RDW: 15 % (ref 11.5–15.5)
WBC: 3.7 10*3/uL — AB (ref 4.0–10.5)

## 2018-09-16 LAB — COMPREHENSIVE METABOLIC PANEL
ALT: 40 U/L (ref 0–44)
AST: 98 U/L — AB (ref 15–41)
Albumin: 2.5 g/dL — ABNORMAL LOW (ref 3.5–5.0)
Alkaline Phosphatase: 76 U/L (ref 38–126)
Anion gap: 7 (ref 5–15)
BUN: 18 mg/dL (ref 8–23)
CHLORIDE: 100 mmol/L (ref 98–111)
CO2: 24 mmol/L (ref 22–32)
CREATININE: 1.28 mg/dL — AB (ref 0.61–1.24)
Calcium: 8.1 mg/dL — ABNORMAL LOW (ref 8.9–10.3)
GFR calc Af Amer: >60 mL/min (ref >60)
GFR, EST NON AFRICAN AMERICAN: 59 mL/min — AB (ref >60)
Glucose, Bld: 159 mg/dL — ABNORMAL HIGH (ref 70–99)
POTASSIUM: 3.8 mmol/L (ref 3.5–5.1)
SODIUM: 131 mmol/L — AB (ref 135–145)
Total Bilirubin: 2.6 mg/dL — ABNORMAL HIGH (ref 0.3–1.2)
Total Protein: 6.9 g/dL (ref 6.5–8.1)

## 2018-09-16 LAB — MAGNESIUM: MAGNESIUM: 1.7 mg/dL (ref 1.7–2.4)

## 2018-09-16 MED ORDER — GADOBUTROL 1 MMOL/ML IV SOLN
7.0000 mL | Freq: Once | INTRAVENOUS | Status: AC | PRN
  Administered 2018-09-16: 7 mL via INTRAVENOUS

## 2018-09-16 MED ORDER — IOHEXOL 300 MG/ML  SOLN
75.0000 mL | Freq: Once | INTRAMUSCULAR | Status: AC | PRN
  Administered 2018-09-16: 75 mL via INTRAVENOUS

## 2018-09-17 LAB — AFP TUMOR MARKER: AFP, SERUM, TUMOR MARKER: 65.5 ng/mL — AB (ref 0.0–8.3)

## 2018-09-20 ENCOUNTER — Inpatient Hospital Stay (HOSPITAL_BASED_OUTPATIENT_CLINIC_OR_DEPARTMENT_OTHER): Payer: BLUE CROSS/BLUE SHIELD | Admitting: Hematology

## 2018-09-20 ENCOUNTER — Encounter (HOSPITAL_COMMUNITY): Payer: Self-pay | Admitting: Hematology

## 2018-09-20 ENCOUNTER — Other Ambulatory Visit: Payer: Self-pay

## 2018-09-20 VITALS — BP 123/95 | HR 81 | Temp 97.6°F | Resp 16 | Wt 166.2 lb

## 2018-09-20 DIAGNOSIS — Z515 Encounter for palliative care: Secondary | ICD-10-CM | POA: Insufficient documentation

## 2018-09-20 DIAGNOSIS — C7972 Secondary malignant neoplasm of left adrenal gland: Secondary | ICD-10-CM

## 2018-09-20 DIAGNOSIS — Z923 Personal history of irradiation: Secondary | ICD-10-CM

## 2018-09-20 DIAGNOSIS — Z79899 Other long term (current) drug therapy: Secondary | ICD-10-CM

## 2018-09-20 DIAGNOSIS — R197 Diarrhea, unspecified: Secondary | ICD-10-CM

## 2018-09-20 DIAGNOSIS — Z7189 Other specified counseling: Secondary | ICD-10-CM

## 2018-09-20 DIAGNOSIS — C7971 Secondary malignant neoplasm of right adrenal gland: Secondary | ICD-10-CM | POA: Diagnosis not present

## 2018-09-20 DIAGNOSIS — Z87891 Personal history of nicotine dependence: Secondary | ICD-10-CM

## 2018-09-20 DIAGNOSIS — C7951 Secondary malignant neoplasm of bone: Secondary | ICD-10-CM

## 2018-09-20 DIAGNOSIS — C220 Liver cell carcinoma: Secondary | ICD-10-CM

## 2018-09-20 DIAGNOSIS — R188 Other ascites: Secondary | ICD-10-CM

## 2018-09-20 NOTE — Progress Notes (Signed)
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 His chest wall pain is more constant from his fracture. He is also having abdominal pain after he eats. He is not tolerating the medication well. He is having diarrhea after he eats. Reports occasional nausea and vomiting. Had not noticed any recent bleeding such as epistaxis, hematuria or hematochezia. Denies recent chest pain on exertion, shortness of breath on minimal exertion, pre-syncopal episodes, or palpitations. Denies any numbness or tingling in hands or feet. Denies any recent fevers, infections, or recent hospitalizations. He reports his appetite at 25% and his energy level is 50%.     REVIEW OF SYSTEMS:  Review of Systems  Cardiovascular: Positive for chest pain (due to fracture).  Gastrointestinal: Positive for diarrhea, nausea and vomiting.  All other systems reviewed and are negative.    PAST MEDICAL/SURGICAL HISTORY:  Past Medical History:  Diagnosis Date  . High cholesterol   . PAD (peripheral artery disease) ([place])    Past Surgical History:  Procedure Laterality Date  . BACK SURGERY    . IR RADIOLOGIST EVAL & MGMT  01/12/2018     SOCIAL HISTORY:  Social History   Socioeconomic History  . Marital status: Married    Spouse name: Not on file  . Number of children: Not on file  . Years of education: Not on file  . Highest education level: Not on file  Occupational History  . Not on file  Social Needs  . Financial resource strain: Not on file  . Food insecurity:    Worry: Not on file    Inability: Not on file  . Transportation needs:    Medical: Not on  file    Non-medical: Not on file  Tobacco Use  . Smoking status: Former Smoker    Packs/day: 2.00    Years: 40.00    Pack years: 80.00    Types: Cigarettes    Last attempt to quit: 12/09/2012    Years since quitting: 5.7  . Smokeless tobacco: Never Used  Substance and Sexual Activity  . Alcohol use: Yes    Alcohol/week: 15.0 standard drinks    Types: 15 Cans of beer per week    Comment: 6 3times a week  . Drug use: Never  . Sexual activity: Not on file  Lifestyle  . Physical activity:    Days per week: Not on file    Minutes per session: Not on file  . Stress: Not on file  Relationships  . Social connections:    Talks on phone: Not on file    Gets together: Not on file    Attends religious service: Not on file    Active member of club or organization: Not on file    Attends meetings of clubs or organizations: Not on file    Relationship status: Not on file  . Intimate partner violence:    Fear of current or ex partner: Not on file    Emotionally abused: Not on file    Physically abused: Not on file    Forced sexual activity: Not on file  Other Topics Concern  . Not on file  Social  History Narrative  . Not on file    FAMILY HISTORY:  Family History  Problem Relation Age of Onset  . Alzheimer's disease Mother   . Cancer Sister        breast    CURRENT MEDICATIONS:  Outpatient Encounter Medications as of 09/20/2018  Medication Sig  . atorvastatin (LIPITOR) 40 MG tablet Take 40 mg by mouth daily.  . clopidogrel (PLAVIX) 75 MG tablet Take 75 mg by mouth daily.  . diphenoxylate-atropine (LOMOTIL) 2.5-0.025 MG tablet TAKE 2 TABLET BY MOUTH FOUR TIMES DAILY AS NEEDED FOR DIARRHEA  . lenvatinib 8 mg daily dose (LENVIMA) 2 x 4 MG capsule Take 2 capsules (8 mg total) by mouth daily.  . ondansetron (ZOFRAN) 8 MG tablet Take 1 tablet (8 mg total) by mouth every 8 (eight) hours as needed for nausea or vomiting.  Marland Kitchen oxyCODONE (ROXICODONE) 15 MG immediate release tablet Take 1  tablet (15 mg total) by mouth every 6 (six) hours as needed for pain.  Marland Kitchen prochlorperazine (COMPAZINE) 10 MG tablet Take 1 tablet (10 mg total) by mouth every 6 (six) hours as needed for nausea or vomiting.  . [DISCONTINUED] diclofenac sodium (VOLTAREN) 1 % GEL Apply 1 application topically 3 (three) times daily.  . [DISCONTINUED] Potassium 95 MG TABS Take 1 tablet by mouth daily as needed (Cramping).   No facility-administered encounter medications on file as of 09/20/2018.     ALLERGIES:  No Known Allergies   PHYSICAL EXAM:  ECOG Performance status: 1  Vitals:   09/20/18 1000  BP: (!) 123/95  Pulse: 81  Resp: 16  Temp: 97.6 F (36.4 C)  SpO2: 95%   Filed Weights   09/20/18 1000  Weight: 166 lb 4 oz (75.4 kg)    Physical Exam Constitutional:      Appearance: He is ill-appearing.  Abdominal:     General: There is distension.  Musculoskeletal: Normal range of motion.  Skin:    General: Skin is warm and dry.  Neurological:     Mental Status: He is alert and oriented to person, place, and time. Mental status is at baseline.  Psychiatric:        Mood and Affect: Mood normal.        Behavior: Behavior normal.        Thought Content: Thought content normal.        Judgment: Judgment normal.      LABORATORY DATA:  I have reviewed the labs as listed.  CBC    Component Value Date/Time   WBC 3.7 (L) 09/16/2018 0852   RBC 4.99 09/16/2018 0852   HGB 17.5 (H) 09/16/2018 0852   HCT 50.8 09/16/2018 0852   PLT 56 (L) 09/16/2018 0852   MCV 101.8 (H) 09/16/2018 0852   MCH 35.1 (H) 09/16/2018 0852   MCHC 34.4 09/16/2018 0852   RDW 15.0 09/16/2018 0852   LYMPHSABS 1.3 09/16/2018 0852   MONOABS 0.4 09/16/2018 0852   EOSABS 0.0 09/16/2018 0852   BASOSABS 0.0 09/16/2018 0852   CMP Latest Ref Rng & Units 09/16/2018 08/13/2018 07/08/2018  Glucose 70 - 99 mg/dL 159(H) 114(H) 102(H)  BUN 8 - 23 mg/dL 18 17 14   Creatinine 0.61 - 1.24 mg/dL 1.28(H) 1.03 0.94  Sodium 135 - 145  mmol/L 131(L) 136 136  Potassium 3.5 - 5.1 mmol/L 3.8 4.8 4.3  Chloride 98 - 111 mmol/L 100 105 106  CO2 22 - 32 mmol/L 24 26 26   Calcium 8.9 - 10.3 mg/dL 8.1(L) 8.3(L)  8.4(L)  Total Protein 6.5 - 8.1 g/dL 6.9 7.4 7.5  Total Bilirubin 0.3 - 1.2 mg/dL 2.6(H) 2.3(H) 1.7(H)  Alkaline Phos 38 - 126 U/L 76 90 94  AST 15 - 41 U/L 98(H) 76(H) 85(H)  ALT 0 - 44 U/L 40 47(H) 47(H)       DIAGNOSTIC IMAGING:  I have independently reviewed the scans and discussed with the patient.   I have reviewed Francene Finders, NP's note and agree with the documentation.  I personally performed a face-to-face visit, made revisions and my assessment and plan is as follows.    ASSESSMENT & PLAN:   Metastatic hepatocellular carcinoma to bone ([place]) 1.  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 -Biopsy of the sternal lesion on 12/29/2017 shows metastatic hepatocellular carcinoma.  Child's class A with 6 points.  AFP of 33. -MRI of the abdomen on 01/13/2018 shows large 15.4 cm HCC in segment 4A, with nonocclusive tumor extension into suprahepatic IVC.  Smaller 1.6 cm segment 7 right lower lobe LI-RADS 4 with moderate likelihood of HCC and 1.4 cm caudate lobe lesion.  Bilateral adrenal metastasis, left more than right present. - Sternal radiation therapy finished on 02/09/2018, 5 fractions. - Lenvatinib 12 mg daily started on 02/10/2018.  He had developed mouth sores, loss of weight and severe lack of energy.  Lenvatinib was held on 02/23/2018. -Lenvatinib was started back at 4 mg daily for 1 week on 03/08/2018, increased to 8 mg daily on 03/12/2018.  He is tolerating it very well.  He has developed hoarseness of his voice since he started back on lenvatinib.  It comes and goes. - MRI of the abdomen on 07/08/2018 shows dominant hepatocellular carcinoma, demonstrating mild decrease in  size from prior exam.  Bilateral adrenal metastasis also improved.  Increase in volume of perihepatic ascites.  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 - We discussed the results of the CT chest dated 09/16/2018 which showed fracture of the sternum in the lower third.  No other significant findings. -We discussed the results of the MRI of the abdomen dated 09/16/2018 which showed little interval change in the hepatic mass compared to MRI on 07/08/2018.  Dominant mass in the superior aspect of the central liver anteriorly not significantly changed.  Large volume ascites predominantly along the right hepatic margin is not changed. -AFP has come down to 65.5. - Patient reports that he is feeling very tired and has abdominal pains while taking lenvatinib.  He is also not able to eat well. - We talked about the prognosis if he completely stops lenvatinib. -I have proposed him to start on nivolumab 480 mg once every 4 weeks to control his disease.  We talked about immunotherapy related side effects including but not limited to dermatitis, colitis, pneumonitis, hypophysitis among others. - I will reevaluate him in 2 weeks.  He will stop lenvatinib starting tomorrow.  Once he gains his strength back, he will start nivolumab.  2.  Sternal pain: -Status post XRT to the sternum on 02/09/2018 and then will. -He reports slight worsening of the sternal pain on and off.  He is not requiring any pain medicine.   3.  Diarrhea: -Diarrhea has improved lately.  He is not using Lomotil on a regular basis.      Orders placed this encounter:  Orders Placed This Encounter  Procedures  .  AFP tumor marker  . Magnesium  . CBC with Differential/Platelet  . Comprehensive metabolic panel      Charles Jack, MD [place] [phone]

## 2018-09-20 NOTE — Assessment & Plan Note (Signed)
1.  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 -Biopsy of the sternal lesion on 12/29/2017 shows metastatic hepatocellular carcinoma.  Child's class A with 6 points.  AFP of 33. -MRI of the abdomen on 01/13/2018 shows large 15.4 cm HCC in segment 4A, with nonocclusive tumor extension into suprahepatic IVC.  Smaller 1.6 cm segment 7 right lower lobe LI-RADS 4 with moderate likelihood of HCC and 1.4 cm caudate lobe lesion.  Bilateral adrenal metastasis, left more than right present. - Sternal radiation therapy finished on 02/09/2018, 5 fractions. - Lenvatinib 12 mg daily started on 02/10/2018.  He had developed mouth sores, loss of weight and severe lack of energy.  Lenvatinib was held on 02/23/2018. -Lenvatinib was started back at 4 mg daily for 1 week on 03/08/2018, increased to 8 mg daily on 03/12/2018.  He is tolerating it very well.  He has developed hoarseness of his voice since he started back on lenvatinib.  It comes and goes. - MRI of the abdomen on 07/08/2018 shows dominant hepatocellular carcinoma, demonstrating mild decrease in size from prior exam.  Bilateral adrenal metastasis also improved.  Increase in volume of perihepatic ascites.  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 - We discussed the results of the CT chest dated 09/16/2018 which showed fracture of the sternum in the lower third.  No other significant findings. -We discussed the results of the MRI of the abdomen dated 09/16/2018 which showed little interval change in the hepatic mass compared to MRI on 07/08/2018.  Dominant mass in the superior aspect of the central liver anteriorly not significantly changed.  Large volume ascites predominantly along the right hepatic margin is not changed. -AFP has come down to 65.5. - Patient reports that he is feeling very tired and has abdominal pains while  taking lenvatinib.  He is also not able to eat well. - We talked about the prognosis if he completely stops lenvatinib. -I have proposed him to start on nivolumab 480 mg once every 4 weeks to control his disease.  We talked about immunotherapy related side effects including but not limited to dermatitis, colitis, pneumonitis, hypophysitis among others. - I will reevaluate him in 2 weeks.  He will stop lenvatinib starting tomorrow.  Once he gains his strength back, he will start nivolumab.  2.  Sternal pain: -Status post XRT to the sternum on 02/09/2018 and then will. -He reports slight worsening of the sternal pain on and off.  He is not requiring any pain medicine.   3.  Diarrhea: -Diarrhea has improved lately.  He is not using Lomotil on a regular basis.

## 2018-09-20 NOTE — Patient Instructions (Signed)
[facility] at [facility] Discharge Instructions     Thank you for choosing [facility] to provide your oncology and hematology care.  To afford each patient quality time with our provider, please arrive at least 15 minutes before your scheduled appointment time.   If you have a lab appointment with the Cancer Center please come in thru the  Main Entrance and check in at the main information desk  You need to re-schedule your appointment should you arrive 10 or more minutes late.  We strive to give you quality time with our providers, and arriving late affects you and other patients whose appointments are after yours.  Also, if you no show three or more times for appointments you may be dismissed from the clinic at the providers discretion.     Again, thank you for choosing [facility].  Our hope is that these requests will decrease the amount of time that you wait before being seen by our physicians.       _____________________________________________________________  Should you have questions after your visit to [facility], please contact our office at [phone] between the hours of 8:00 a.m. and 4:30 p.m.  Voicemails left after 4:00 p.m. will not be returned until the following business day.  For prescription refill requests, have your pharmacy contact our office and allow 72 hours.    Cancer Center Support Programs:   > Cancer Support Group  2nd Tuesday of the month 1pm-2pm, Journey Room    

## 2018-09-20 NOTE — Progress Notes (Signed)
START OFF PATHWAY REGIMEN - Other Dx   [id number]:Nivolumab 480 mg D1 q28 Days:   A cycle is every 28 days:     Nivolumab   **Always confirm dose/schedule in your pharmacy ordering system**  Patient Characteristics: Intent of Therapy: Non-Curative / Palliative Intent, Discussed with Patient

## 2018-09-27 ENCOUNTER — Inpatient Hospital Stay (HOSPITAL_COMMUNITY): Payer: BLUE CROSS/BLUE SHIELD

## 2018-09-27 NOTE — Patient Instructions (Signed)
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  You will be treated with nivolumab (Opdivo) every 4 weeks.  The intent of this treatment is to help control your cancer and alleviate some of your symptoms related to your cancer.  You will see the doctor regularly throughout treatment.  We monitor your lab work prior to every treatment. The doctor monitors your response to treatment by the way you are feeling, your blood work, and scans periodically.  There will be wait times while you are here for treatment.  It will take about 30 minutes to 1 hour for your lab work to result.  Then there will be wait times while pharmacy mixes your medications.   Nivolumab (Opdivo)  About This Drug Nivolumab is used to treat cancer. It is given in the vein (IV).  Possible Side Effects . Bone marrow depression. This is a decrease in the number of white blood cells, red blood cells, and platelets. This may raise your risk of infection, make you tired and weak (fatigue), and raise your risk of bleeding. . Tiredness and weakness . Joint, muscle and bone pain . Back pain . Loose bowel movements (diarrhea) . Nausea . Decreased appetite (decreased hunger) . Constipation (not able to move bowels) . Cough and trouble breathing . Upper respiratory infection . Fever . Rash and itching . Electrolyte changes  Warnings and Precautions  . This drug works with your immune system and can cause inflammation in any of your organs and tissues and can change how they work. This may put you at risk for developing serious medical problems which can very rarely be fatal. . Colitis (swelling (inflammation) in the colon) - symptoms are loose bowel movements (diarrhea) stomach cramping, and sometimes blood in the bowel movements . Changes in liver function . Changes in kidney function . Inflammation (swelling) of the lungs which can very rarely be fatal - you  may have a dry cough or trouble breathing. . This drug may affect some of your hormone glands (especially the thyroid, adrenals, pituitary and pancreas). . Blood sugar levels may change and you may develop diabetes. If you already have diabetes, changes may need to be made to your diabetes medication. . Severe allergic skin reaction which can very rarely be fatal. You may develop blisters on your skin that are filled with fluid or a severe red rash all over your body that may be painful. . Changes in your central nervous system can happen. The central nervous system is made up of your brain and spinal cord. You could feel extreme tiredness, agitation, confusion, hallucinations (see or hear things that are not there), trouble understanding or speaking, loss of control of your bowels or bladder, eyesight changes, numbness or lack of strength to your arms, legs, face, or body, and coma. If you start to have any of these symptoms let your doctor know right away. . While you are getting this drug in your vein (IV), you may have a reaction to the drug. Sometimes you may be given medication to stop or lessen these side effects. Your nurse will check you closely for these signs: fever or shaking chills, flushing, facial swelling, feeling dizzy, headache, trouble breathing, rash, itching, chest tightness, or chest pain. These reactions may happen after your infusion. If this happens, call 911 for emergency care. . Increased risk of complications, which may very rarely be fatal, in patients who will undergo a stem cell transplant  after receiving nivolumab.  Important Information  . This drug may be present in the saliva, tears, sweat, urine, stool, vomit, semen, and vaginal secretions. Talk to your doctor and/or your nurse about the necessary precautions to take during this time.  Treating Side Effects  . To decrease infection, wash your hands regularly . Avoid close contact with people who have a cold, the  flu, or other infections. . Take your temperature as your doctor or nurse tells you, and whenever you feel like you may have a fever . To help decrease bleeding, use a soft toothbrush. Check with your nurse before using dental floss. . Be very careful when using knives or tools . Use an electric shaver instead of a razor . Ask your doctor or nurse about medicines that are available to help stop or lessen constipation, diarrhea and/or nausea. . Drink plenty of fluids (a minimum of eight glasses per day is recommended). . If you are not able to move your bowels, check with your doctor or nurse before you use any enemas, laxatives, or suppositories . To help with nausea and vomiting, eat small, frequent meals instead of three large meals a day. Choose foods and drinks that are at room temperature. Ask your nurse or doctor about other helpful tips and medicine that is available to help or stop lessen these symptoms. . If you get diarrhea, eat low-fiber foods that are high in protein and calories and avoid foods that can irritate your digestive tracts or lead to cramping. Ask your nurse or doctor about medicine that can lessen or stop your diarrhea. . To help with decreased appetite, eat small, frequent meals . Eat high caloric food such as pudding, ice cream, yogurt and milkshakes. . Manage tiredness by pacing your activities for the day. Be sure to include periods of rest between energy-draining activities . Keeping your pain under control is important to your wellbeing. Please tell your doctor or nurse if you are experiencing pain. . If you have diabetes, keep good control of your blood sugar level. Tell your nurse or your doctor if your glucose levels are higher or lower than normal . If you get a rash do not put anything on it unless your doctor or nurse says you may. Keep the area around the rash clean and dry. Ask your doctor for medicine if your rash bothers you. . Infusion reactions may occur  after your infusion. If this happens, call 911 for emergency care.  Food and Drug Interactions  . There are no known interactions of nivolumab with food or other medications. . Tell your doctor and pharmacist about all the medicines and dietary supplements (vitamins, minerals, herbs and others) that you are taking at this time. The safety and use of dietary supplements and alternative diets are often not known. Using these might affect your cancer or interfere with your treatment. Until more is known, you should not use dietary supplements or alternative diets without your cancer doctor's help.  When to Call the Doctor  Call your doctor or nurse if you have any of these symptoms and/or any new or unusual symptoms: . Fever of 100.5 F (38 C) or higher . Chills . Easy bleeding or bruising . Wheezing or trouble breathing . Dry cough, or cough with yellow, green or bloody mucus . Confusion and/or agitation . Hallucinations . Trouble understanding or speaking . Blurry vision or changes in your eyesight . Numbness or lack of strength to your arms, legs, face, or body .  Feeling dizzy or lightheaded . Loose bowel movements (diarrhea) more than 4 times a day or diarrhea with weakness or lightheadedness . Nausea that stops you from eating or drinking, and/or that is not relieved by prescribed medicines . Lasting loss of appetite or rapid weight loss of five pounds in a week . No bowel movement for 3 days or you feel uncomfortable . Bad abdominal pain, especially in upper right area . Fatigue or extreme weakness that interferes with normal activities . Decreased urine . Unusual thirst or passing urine often . Rash that is not relieved by prescribed medicines . Rash or itching . Flu-like symptoms: fever, headache, muscle and joint aches, and fatigue (low energy, feeling weak) . Signs of liver problems: dark urine, pale bowel movements, bad stomach pain, feeling very tired and weak, unusual  itching, or yellowing of the eyes or skin. . Signs of infusion reaction: fever or shaking chills, flushing, facial swelling, feeling dizzy, headache, trouble breathing, rash, itching, chest tightness, or chest pain. . If you think you may be pregnant  Reproduction Warnings  . Pregnancy warning: This drug can have harmful effects on the unborn baby. Women of child bearing potential should use effective methods of birth control during your cancer treatment and for at least 5 months after treatment. Let your doctor know right away if you think you may be pregnant. . Breastfeeding warning: It is not known if this drug passes into breast milk. For this reason, women should not breast feed during treatment because this drug could enter the breast milk and cause harm to a breast feeding baby. . Fertility warning: Human fertility studies have not been done with this drug. Talk with your doctor or nurse if you plan to have children. Ask for information on sperm or egg banking.  SELF CARE ACTIVITIES WHILE ON CHEMOTHERAPY:  Hydration Increase your fluid intake 48 hours prior to treatment and drink at least 8 to 12 cups (64 ounces) of water/decaffeinated beverages per day after treatment. You can still have your cup of coffee or soda but these beverages do not count as part of your 8 to 12 cups that you need to drink daily. No alcohol intake.  Medications Continue taking your normal prescription medication as prescribed.  If you start any new herbal or new supplements please let us know first to make sure it is safe.  Mouth Care Have teeth cleaned professionally before starting treatment. Keep dentures and partial plates clean. Use soft toothbrush and do not use mouthwashes that contain alcohol. Biotene is a good mouthwash that is available at most pharmacies or may be ordered by calling [phone]. Use warm salt water gargles (1 teaspoon salt per 1 quart warm water) before and after meals and at bedtime.  Or you may rinse with 2 tablespoons of three-percent hydrogen peroxide mixed in eight ounces of water. If you are still having problems with your mouth or sores in your mouth please call the clinic. If you need dental work, please let the doctor know before you go for your appointment so that we can coordinate the best possible time for you in regards to your chemo regimen. You need to also let your dentist know that you are actively taking chemo. We may need to do labs prior to your dental appointment.  Skin Care Always use sunscreen that has not expired and with SPF (Sun Protection Factor) of 50 or higher. Wear hats to protect your head from the sun. Remember to use sunscreen  on your hands, ears, face, & feet.  Use good moisturizing lotions such as udder cream, eucerin, or even Vaseline. Some chemotherapies can cause dry skin, color changes in your skin and nails.    . Avoid long, hot showers or baths. . Use gentle, fragrance-free soaps and laundry detergent. . Use moisturizers, preferably creams or ointments rather than lotions because the thicker consistency is better at preventing skin dehydration. Apply the cream or ointment within 15 minutes of showering. Reapply moisturizer at night, and moisturize your hands every time after you wash them.  Hair Loss (if your doctor says your hair will fall out)  . If your doctor says that your hair is likely to fall out, decide before you begin chemo whether you want to wear a wig. You may want to shop before treatment to match your hair color. . Hats, turbans, and scarves can also camouflage hair loss, although some people prefer to leave their heads uncovered. If you go bare-headed outdoors, be sure to use sunscreen on your scalp. . Cut your hair short. It eases the inconvenience of shedding lots of hair, but it also can reduce the emotional impact of watching your hair fall out. . Don't perm or color your hair during chemotherapy. Those chemical  treatments are already damaging to hair and can enhance hair loss. Once your chemo treatments are done and your hair has grown back, it's OK to resume dyeing or perming hair. With chemotherapy, hair loss is almost always temporary. But when it grows back, it may be a different color or texture. In older adults who still had hair color before chemotherapy, the new growth may be completely gray.  Often, new hair is very fine and soft.  Infection Prevention Please wash your hands for at least 30 seconds using warm soapy water. Handwashing is the #1 way to prevent the spread of germs. Stay away from sick people or people who are getting over a cold. If you develop respiratory systems such as green/yellow mucus production or productive cough or persistent cough let us know and we will see if you need an antibiotic. It is a good idea to keep a pair of gloves on when going into grocery stores/[employer] to decrease your risk of coming into contact with germs on the carts, etc. Carry alcohol hand gel with you at all times and use it frequently if out in public. If your temperature reaches 100.5 or higher please call the clinic and let us know.  If it is after hours or on the weekend please go to the ER if your temperature is over 100.5.  Please have your own personal thermometer at home to use.    Sex and bodily fluids If you are going to have sex, a condom must be used to protect the person that isn't taking chemotherapy. Chemo can decrease your libido (sex drive). For a few days after chemotherapy, chemotherapy can be excreted through your bodily fluids.  When using the toilet please close the lid and flush the toilet twice.  Do this for a few day after you have had chemotherapy.   Effects of chemotherapy on your sex life Some changes are simple and won't last long. They won't affect your sex life permanently. Sometimes you may feel: . too tired . not strong enough to be very active . sick or sore  . not in  the mood . anxious or low  Your anxiety might not seem related to sex. For example, you may be  worried about the cancer and how your treatment is going. Or you may be worried about money, or about how you family are coping with your illness. These things can cause stress, which can affect your interest in sex. It's important to talk to your partner about how you feel. Remember - the changes to your sex life don't usually last long. There's usually no medical reason to stop having sex during chemo. The drugs won't have any long term physical effects on your performance or enjoyment of sex. Cancer can't be passed on to your partner during sex  Contraception It's important to use reliable contraception during treatment. Avoid getting pregnant while you or your partner are having chemotherapy. This is because the drugs may harm the baby. Sometimes chemotherapy drugs can leave a man or woman infertile.  This means you would not be able to have children in the future. You might want to talk to someone about permanent infertility. It can be very difficult to learn that you may no longer be able to have children. Some people find counselling helpful. There might be ways to preserve your fertility, although this is easier for men than for women. You may want to speak to a fertility expert. You can talk about sperm banking or harvesting your eggs. You can also ask about other fertility options, such as donor eggs. If you have or have had breast cancer, your doctor might advise you not to take the contraceptive pill. This is because the hormones in it might affect the cancer.  It is not known for sure whether or not chemotherapy drugs can be passed on through semen or secretions from the vagina. Because of this some doctors advise people to use a barrier method if you have sex during treatment. This applies to vaginal, anal or oral sex. Generally, doctors advise a barrier method only for the time you are actually  having the treatment and for about a week after your treatment. Advice like this can be worrying, but this does not mean that you have to avoid being intimate with your partner. You can still have close contact with your partner and continue to enjoy sex.  Animals If you have cats or birds we just ask that you not change the litter or change the cage.  Please have someone else do this for you while you are on chemotherapy.   Food Safety During and After Cancer Treatment Food safety is important for people both during and after cancer treatment. Cancer and cancer treatments, such as chemotherapy, radiation therapy, and stem cell/bone marrow transplantation, often weaken the immune system. This makes it harder for your body to protect itself from foodborne illness, also called food poisoning. Foodborne illness is caused by eating food that contains harmful bacteria, parasites, or viruses.  Foods to avoid  Some foods have a higher risk of becoming tainted with bacteria. These include: Marland Kitchen Unwashed fresh fruit and vegetables, especially leafy vegetables that can hide dirt and other contaminants . Raw sprouts, such as alfalfa sprouts . Raw or undercooked beef, especially ground beef, or other raw or undercooked meat and poultry . Fatty, fried, or spicy foods immediately before or after treatment.  These can sit heavy on your stomach and make you feel nauseous. . Raw or undercooked shellfish, such as oysters. . Sushi and sashimi, which often contain raw fish.  . Unpasteurized beverages, such as unpasteurized fruit juices, raw milk, raw yogurt, or cider . Undercooked eggs, such as soft boiled, over easy,  and poached; raw, unpasteurized eggs; or foods made with raw egg, such as homemade raw cookie dough and homemade mayonnaise  Simple steps for food safety  Shop smart.    Do not buy food stored or displayed in an unclean area. . Do not buy bruised or damaged fruits or vegetables. . Do not buy cans  that have cracks, dents, or bulges. . Pick up foods that can spoil at the end of your shopping trip and store them in a cooler on the way home.  Prepare and clean up foods carefully. . Rinse all fresh fruits and vegetables under running water, and dry them with a clean towel or paper towel. . Clean the top of cans before opening them. . After preparing food, wash your hands for 20 seconds with hot water and soap. Pay special attention to areas between fingers and under nails. . Clean your utensils and dishes with hot water and soap. Marland Kitchen Disinfect your kitchen and cutting boards using 1 teaspoon of liquid, unscented bleach mixed into 1 quart of water.    Dispose of old food. . Eat canned and packaged food before its expiration date (the "use by" or "best before" date). . Consume refrigerated leftovers within 3 to 4 days. After that time, throw out the food. Even if the food does not smell or look spoiled, it still may be unsafe. Some bacteria, such as Listeria, can grow even on foods stored in the refrigerator if they are kept for too long.  Take precautions when eating out. . At restaurants, avoid buffets and salad bars where food sits out for a long time and comes in contact with many people. Food can become contaminated when someone with a virus, often a norovirus, or another "bug" handles it. . Put any leftover food in a "to-go" container yourself, rather than having the server do it. And, refrigerate leftovers as soon as you get home. . Choose restaurants that are clean and that are willing to prepare your food as you order it cooked.    Over-the-Counter Meds:  Colace - 100 mg capsules - take 2 capsules daily.  If this doesn't help then you can increase to 2 capsules twice daily.  Call us if this does not help your bowels move.   Imodium 2mg  capsule. Take 2 capsules after the 1st loose stool and then 1 capsule every 2 hours until you go a total of 12 hours without having a loose stool.  Call the [facility] if loose stools continue. If diarrhea occurs at bedtime, take 2 capsules at bedtime. Then take 2 capsules every 4 hours until morning. Call [facility].    Diarrhea Sheet   If you are having loose stools/diarrhea, please purchase Imodium and begin taking as outlined:  At the first sign of poorly formed or loose stools you should begin taking Imodium (loperamide) 2 mg capsules.  Take two caplets (4mg ) followed by one caplet (2mg ) every 2 hours until you have had no diarrhea for 12 hours.  During the night take two caplets (4mg ) at bedtime and continue every 4 hours during the night until the morning.  Stop taking Imodium only after there is no sign of diarrhea for 12 hours.    Always call the [facility] if you are having loose stools/diarrhea that you can't get under control.  Loose stools/diarrhea leads to dehydration (loss of water) in your body.  We have other options of trying to get the loose stools/diarrhea to stop but you  must let us know!   Constipation Sheet  Colace - 100 mg capsules - take 2 capsules daily.  If this doesn't help then you can increase to 2 capsules twice daily.  Please call if the above does not work for you.   Do not go more than 2 days without a bowel movement.  It is very important that you do not become constipated.  It will make you feel sick to your stomach (nausea) and can cause abdominal pain and vomiting.   Nausea Sheet   Compazine/Prochlorperazine 10mg  tablet. Take 1 tablet every 6 hours as needed for nausea/vomiting. (This can make you sleepy)  If you are having persistent nausea (nausea that does not stop) please call the [facility] and let us know the amount of nausea that you are experiencing.  If you begin to vomit, you need to call the [facility] and if it is the weekend and you have vomited more than one time and can't get it to stop-go to the Emergency Room.  Persistent nausea/vomiting can lead to dehydration (loss  of fluid in your body) and will make you feel terrible.   Ice chips, sips of clear liquids, foods that are @ room temperature, crackers, and toast tend to be better tolerated.   SYMPTOMS TO REPORT AS SOON AS POSSIBLE AFTER TREATMENT:   FEVER GREATER THAN 100.5 F  CHILLS WITH OR WITHOUT FEVER  NAUSEA AND VOMITING THAT IS NOT CONTROLLED WITH YOUR NAUSEA MEDICATION  UNUSUAL SHORTNESS OF BREATH  UNUSUAL BRUISING OR BLEEDING  TENDERNESS IN MOUTH AND THROAT WITH OR WITHOUT PRESENCE OF ULCERS  URINARY PROBLEMS  BOWEL PROBLEMS  UNUSUAL RASH      Wear comfortable clothing and clothing appropriate for easy access to any Portacath or PICC line. Let us know if there is anything that we can do to make your therapy better!    What to do if you need assistance after hours or on the weekends: CALL [phone].  HOLD on the line, do not hang up.  You will hear multiple messages but at the end you will be connected with a nurse triage line.  They will contact the doctor if necessary.  Most of the time they will be able to assist you.  Do not call the hospital operator.      I have been informed and understand all of the instructions given to me and have received a copy. I have been instructed to call the clinic [phone] or my family physician as soon as possible for continued medical care, if indicated. I do not have any more questions at this time but understand that I may call the [facility] or the Patient Navigator at [phone] during office hours should I have questions or need assistance in obtaining follow-up care.

## 2018-09-29 ENCOUNTER — Emergency Department (HOSPITAL_COMMUNITY): Payer: BLUE CROSS/BLUE SHIELD

## 2018-09-29 ENCOUNTER — Other Ambulatory Visit: Payer: Self-pay

## 2018-09-29 ENCOUNTER — Encounter (HOSPITAL_COMMUNITY): Payer: Self-pay

## 2018-09-29 ENCOUNTER — Inpatient Hospital Stay (HOSPITAL_COMMUNITY): Payer: BLUE CROSS/BLUE SHIELD

## 2018-09-29 ENCOUNTER — Inpatient Hospital Stay (HOSPITAL_COMMUNITY)
Admission: EM | Admit: 2018-09-29 | Discharge: 2018-10-02 | DRG: 640 | Disposition: A | Discharge status: Home / Self Care | Payer: BLUE CROSS/BLUE SHIELD | Attending: Internal Medicine | Admitting: Internal Medicine

## 2018-09-29 DIAGNOSIS — E871 Hypo-osmolality and hyponatremia: Principal | ICD-10-CM | POA: Diagnosis present

## 2018-09-29 DIAGNOSIS — C220 Liver cell carcinoma: Secondary | ICD-10-CM | POA: Diagnosis present

## 2018-09-29 DIAGNOSIS — M8458XA Pathological fracture in neoplastic disease, other specified site, initial encounter for fracture: Secondary | ICD-10-CM | POA: Diagnosis present

## 2018-09-29 DIAGNOSIS — K7460 Unspecified cirrhosis of liver: Secondary | ICD-10-CM | POA: Diagnosis present

## 2018-09-29 DIAGNOSIS — N179 Acute kidney failure, unspecified: Secondary | ICD-10-CM | POA: Diagnosis present

## 2018-09-29 DIAGNOSIS — R180 Malignant ascites: Secondary | ICD-10-CM | POA: Diagnosis not present

## 2018-09-29 DIAGNOSIS — Z515 Encounter for palliative care: Secondary | ICD-10-CM | POA: Diagnosis not present

## 2018-09-29 DIAGNOSIS — E7800 Pure hypercholesterolemia, unspecified: Secondary | ICD-10-CM | POA: Diagnosis present

## 2018-09-29 DIAGNOSIS — Z7189 Other specified counseling: Secondary | ICD-10-CM | POA: Diagnosis not present

## 2018-09-29 DIAGNOSIS — C7971 Secondary malignant neoplasm of right adrenal gland: Secondary | ICD-10-CM | POA: Diagnosis present

## 2018-09-29 DIAGNOSIS — Z79899 Other long term (current) drug therapy: Secondary | ICD-10-CM | POA: Diagnosis not present

## 2018-09-29 DIAGNOSIS — C7951 Secondary malignant neoplasm of bone: Secondary | ICD-10-CM | POA: Diagnosis present

## 2018-09-29 DIAGNOSIS — G9341 Metabolic encephalopathy: Secondary | ICD-10-CM | POA: Diagnosis present

## 2018-09-29 DIAGNOSIS — R278 Other lack of coordination: Secondary | ICD-10-CM | POA: Diagnosis present

## 2018-09-29 DIAGNOSIS — Z7902 Long term (current) use of antithrombotics/antiplatelets: Secondary | ICD-10-CM | POA: Diagnosis not present

## 2018-09-29 DIAGNOSIS — Z87891 Personal history of nicotine dependence: Secondary | ICD-10-CM

## 2018-09-29 DIAGNOSIS — I739 Peripheral vascular disease, unspecified: Secondary | ICD-10-CM | POA: Diagnosis present

## 2018-09-29 DIAGNOSIS — E860 Dehydration: Secondary | ICD-10-CM | POA: Diagnosis present

## 2018-09-29 DIAGNOSIS — Z809 Family history of malignant neoplasm, unspecified: Secondary | ICD-10-CM

## 2018-09-29 DIAGNOSIS — R7401 Elevation of levels of liver transaminase levels: Secondary | ICD-10-CM | POA: Diagnosis present

## 2018-09-29 DIAGNOSIS — R1084 Generalized abdominal pain: Secondary | ICD-10-CM

## 2018-09-29 DIAGNOSIS — K7290 Hepatic failure, unspecified without coma: Secondary | ICD-10-CM | POA: Diagnosis present

## 2018-09-29 DIAGNOSIS — Z807 Family history of other malignant neoplasms of lymphoid, hematopoietic and related tissues: Secondary | ICD-10-CM

## 2018-09-29 DIAGNOSIS — E43 Unspecified severe protein-calorie malnutrition: Secondary | ICD-10-CM | POA: Diagnosis present

## 2018-09-29 DIAGNOSIS — R740 Nonspecific elevation of levels of transaminase and lactic acid dehydrogenase [LDH]: Secondary | ICD-10-CM

## 2018-09-29 DIAGNOSIS — B182 Chronic viral hepatitis C: Secondary | ICD-10-CM | POA: Diagnosis present

## 2018-09-29 DIAGNOSIS — Z923 Personal history of irradiation: Secondary | ICD-10-CM

## 2018-09-29 DIAGNOSIS — Z9221 Personal history of antineoplastic chemotherapy: Secondary | ICD-10-CM

## 2018-09-29 DIAGNOSIS — R188 Other ascites: Secondary | ICD-10-CM | POA: Diagnosis present

## 2018-09-29 DIAGNOSIS — M898X9 Other specified disorders of bone, unspecified site: Secondary | ICD-10-CM

## 2018-09-29 DIAGNOSIS — Z66 Do not resuscitate: Secondary | ICD-10-CM | POA: Diagnosis not present

## 2018-09-29 DIAGNOSIS — C7972 Secondary malignant neoplasm of left adrenal gland: Secondary | ICD-10-CM | POA: Diagnosis present

## 2018-09-29 DIAGNOSIS — C229 Malignant neoplasm of liver, not specified as primary or secondary: Secondary | ICD-10-CM

## 2018-09-29 HISTORY — DX: Malignant (primary) neoplasm, unspecified: C80.1

## 2018-09-29 LAB — COMPREHENSIVE METABOLIC PANEL
ALT: 57 U/L — ABNORMAL HIGH (ref 0–44)
AST: 121 U/L — ABNORMAL HIGH (ref 15–41)
Albumin: 2.4 g/dL — ABNORMAL LOW (ref 3.5–5.0)
Alkaline Phosphatase: 84 U/L (ref 38–126)
Anion gap: 7 (ref 5–15)
BUN: 42 mg/dL — ABNORMAL HIGH (ref 8–23)
CO2: 28 mmol/L (ref 22–32)
Calcium: 8.4 mg/dL — ABNORMAL LOW (ref 8.9–10.3)
Chloride: 87 mmol/L — ABNORMAL LOW (ref 98–111)
Creatinine, Ser: 1.7 mg/dL — ABNORMAL HIGH (ref 0.61–1.24)
GFR calc Af Amer: 48 mL/min — ABNORMAL LOW (ref >60)
GFR calc non Af Amer: 42 mL/min — ABNORMAL LOW (ref >60)
Glucose, Bld: 156 mg/dL — ABNORMAL HIGH (ref 70–99)
Potassium: 4.5 mmol/L (ref 3.5–5.1)
Sodium: 122 mmol/L — ABNORMAL LOW (ref 135–145)
Total Bilirubin: 3.8 mg/dL — ABNORMAL HIGH (ref 0.3–1.2)
Total Protein: 7.3 g/dL (ref 6.5–8.1)

## 2018-09-29 LAB — CBC WITH DIFFERENTIAL/PLATELET
Abs Immature Granulocytes: 0.01 10*3/uL (ref 0.00–0.07)
Basophils Absolute: 0 10*3/uL (ref 0.0–0.1)
Basophils Relative: 0 %
Eosinophils Absolute: 0 10*3/uL (ref 0.0–0.5)
Eosinophils Relative: 0 %
HCT: 47.1 % (ref 39.0–52.0)
Hemoglobin: 16.3 g/dL (ref 13.0–17.0)
Immature Granulocytes: 0 %
Lymphocytes Relative: 18 %
Lymphs Abs: 0.9 10*3/uL (ref 0.7–4.0)
MCH: 35 pg — AB (ref 26.0–34.0)
MCHC: 34.6 g/dL (ref 30.0–36.0)
MCV: 101.1 fL — ABNORMAL HIGH (ref 80.0–100.0)
MONO ABS: 0.7 10*3/uL (ref 0.1–1.0)
Monocytes Relative: 14 %
Neutro Abs: 3.3 10*3/uL (ref 1.7–7.7)
Neutrophils Relative %: 68 %
Platelets: 90 10*3/uL — ABNORMAL LOW (ref 150–400)
RBC: 4.66 MIL/uL (ref 4.22–5.81)
RDW: 15.1 % (ref 11.5–15.5)
WBC: 4.9 10*3/uL (ref 4.0–10.5)
nRBC: 0 % (ref 0.0–0.2)

## 2018-09-29 LAB — URINALYSIS, ROUTINE W REFLEX MICROSCOPIC
Bilirubin Urine: NEGATIVE
Glucose, UA: NEGATIVE mg/dL
Ketones, ur: NEGATIVE mg/dL
Leukocytes, UA: NEGATIVE
Nitrite: NEGATIVE
PH: 5 (ref 5.0–8.0)
Protein, ur: >=300 mg/dL — AB
Specific Gravity, Urine: 1.026 (ref 1.005–1.030)

## 2018-09-29 LAB — BASIC METABOLIC PANEL
ANION GAP: 8 (ref 5–15)
BUN: 37 mg/dL — ABNORMAL HIGH (ref 8–23)
CO2: 22 mmol/L (ref 22–32)
Calcium: 7.9 mg/dL — ABNORMAL LOW (ref 8.9–10.3)
Chloride: 88 mmol/L — ABNORMAL LOW (ref 98–111)
Creatinine, Ser: 1.44 mg/dL — ABNORMAL HIGH (ref 0.61–1.24)
GFR calc Af Amer: 59 mL/min — ABNORMAL LOW (ref >60)
GFR calc non Af Amer: 51 mL/min — ABNORMAL LOW (ref >60)
Glucose, Bld: 109 mg/dL — ABNORMAL HIGH (ref 70–99)
Potassium: 4.6 mmol/L (ref 3.5–5.1)
Sodium: 118 mmol/L — CL (ref 135–145)

## 2018-09-29 LAB — GRAM STAIN: Gram Stain: NONE SEEN

## 2018-09-29 LAB — CORTISOL: Cortisol, Plasma: 16.8 ug/dL

## 2018-09-29 LAB — LIPASE, BLOOD: Lipase: 42 U/L (ref 11–51)

## 2018-09-29 LAB — BODY FLUID CELL COUNT WITH DIFFERENTIAL
Eos, Fluid: 0 %
Lymphs, Fluid: 54 %
Monocyte-Macrophage-Serous Fluid: 38 % — ABNORMAL LOW (ref 50–90)
Neutrophil Count, Fluid: 8 % (ref 0–25)
WBC FLUID: 78 uL (ref 0–1000)

## 2018-09-29 LAB — OSMOLALITY: Osmolality: 281 mOsm/kg (ref 275–295)

## 2018-09-29 LAB — TSH: TSH: 11.498 u[IU]/mL — ABNORMAL HIGH (ref 0.350–4.500)

## 2018-09-29 LAB — AMMONIA: AMMONIA: 16 umol/L (ref 9–35)

## 2018-09-29 LAB — TROPONIN I: Troponin I: <0.03 ng/mL (ref <0.03)

## 2018-09-29 MED ORDER — ENSURE ENLIVE PO LIQD
237.0000 mL | Freq: Two times a day (BID) | ORAL | Status: DC
  Administered 2018-09-30 – 2018-10-01 (×3): 237 mL via ORAL

## 2018-09-29 MED ORDER — LORAZEPAM 2 MG/ML IJ SOLN
0.5000 mg | Freq: Once | INTRAMUSCULAR | Status: AC
  Administered 2018-09-29: 0.5 mg via INTRAVENOUS
  Filled 2018-09-29: qty 1

## 2018-09-29 MED ORDER — SODIUM CHLORIDE 0.9 % IV SOLN
INTRAVENOUS | Status: DC
  Administered 2018-09-29 – 2018-09-30 (×2): via INTRAVENOUS

## 2018-09-29 MED ORDER — MORPHINE SULFATE (PF) 4 MG/ML IV SOLN
4.0000 mg | Freq: Once | INTRAVENOUS | Status: AC
  Administered 2018-09-30: 4 mg via INTRAVENOUS
  Filled 2018-09-29: qty 1

## 2018-09-29 MED ORDER — SODIUM CHLORIDE 0.9 % IV BOLUS
1000.0000 mL | Freq: Once | INTRAVENOUS | Status: AC
  Administered 2018-09-29: 1000 mL via INTRAVENOUS

## 2018-09-29 MED ORDER — MORPHINE SULFATE (PF) 2 MG/ML IV SOLN
INTRAVENOUS | Status: AC
  Administered 2018-09-29: 4 mg via INTRAVENOUS
  Filled 2018-09-29: qty 2

## 2018-09-29 MED ORDER — PROCHLORPERAZINE EDISYLATE 10 MG/2ML IJ SOLN
10.0000 mg | Freq: Four times a day (QID) | INTRAMUSCULAR | Status: DC | PRN
  Administered 2018-09-30: 10 mg via INTRAVENOUS
  Filled 2018-09-29: qty 2

## 2018-09-29 MED ORDER — OXYCODONE HCL 5 MG PO TABS
10.0000 mg | ORAL_TABLET | Freq: Four times a day (QID) | ORAL | Status: DC | PRN
  Administered 2018-10-01: 10 mg via ORAL
  Filled 2018-09-29: qty 2

## 2018-09-29 MED ORDER — LACTATED RINGERS IV SOLN
INTRAVENOUS | Status: DC
  Administered 2018-09-29: 10:00:00 via INTRAVENOUS

## 2018-09-29 MED ORDER — CLOPIDOGREL BISULFATE 75 MG PO TABS
75.0000 mg | ORAL_TABLET | Freq: Every day | ORAL | Status: DC
  Administered 2018-09-30 – 2018-10-01 (×2): 75 mg via ORAL
  Filled 2018-09-29 (×2): qty 1

## 2018-09-29 MED ORDER — LACTULOSE 10 GM/15ML PO SOLN
10.0000 g | Freq: Three times a day (TID) | ORAL | Status: DC
  Administered 2018-09-29 – 2018-10-01 (×4): 10 g via ORAL
  Filled 2018-09-29 (×7): qty 30

## 2018-09-29 MED ORDER — IOHEXOL 300 MG/ML  SOLN
75.0000 mL | Freq: Once | INTRAMUSCULAR | Status: AC | PRN
  Administered 2018-09-29: 75 mL via INTRAVENOUS

## 2018-09-29 NOTE — ED Notes (Addendum)
[phone] Deberah Castle (sister) [phone] [name] (wife)

## 2018-09-29 NOTE — Procedures (Signed)
PreOperative Dx: Cirrhosis, HCC, ascites Postoperative Dx: Cirrhosis, HCC, ascites Procedure:   US guided paracentesis Radiologist:  Thornton Papas Anesthesia:  10 ml of1% lidocaine Specimen:  4.18 L of yellow cloudy ascitic fluid EBL:   < 1 ml Complications: None

## 2018-09-29 NOTE — Consult Note (Signed)
Referring Provider: Barton Dubois, MD and Derek Jack, MD Primary Care Physician:  Patient, No Pcp Per Primary Gastroenterologist:  Dr. Laural Golden  Reason for Consultation:   Abdominal pain and mental status changes in a patient with known metastatic hepatocellular carcinoma.  HPI:   Patient is 63 year old Caucasian male who has a history of hepatitis C discovered about 14 years ago according to his wife but he has never been treated. He presented back in March 2019 with few week history of chest pain and on work-up he was found to have large hepatic mass which was subsequently determined to be hepatocellular carcinoma and he had meds to sternum and adrenal glands.  He was evaluated by Dr. Delton Coombes.  He was not a candidate for liver transplant.  He was initially treated with lenvatinib which he did not tolerate well.  There was however slight reduction in the size of primary lesion.  About 2 weeks ago he had copious diarrhea.  According to his wife he was not able to eat much.  He had been progressively getting weaker.  Lenvatinib was discontinued and plans were made to treat him with nivolumab after a washout. Last evening when his wife got home from work she noted him to be confused.  His intake was very poor.  He was also complaining of abdominal pain and distention.  He was therefore brought to emergency room for evaluation. According to his wife he did not experience nausea vomiting melena fever chills or night sweats.  His diarrhea has resolved.  He had small formed stool yesterday.  She has noted progressive abdominal distention.  There is no history of recent fall or head injury. Lab studies are pertinent for WBC of 4.9 H&H of 16.3 and 47.1 and platelet count was  90 K.  Serum sodium was 122 glucose 156, BUN 42 serum creatinine 1.7.  Serum calcium was 8.4.  Bilirubin was 3.8 with a P of 84 AST 121 and ALT of 57 and albumin was 2.4.  Serum ammonia was normal at 16. Because of  hyponatremia serum cortisol level was checked and it came back normal at 16.8. Abdominal pelvic CT was obtained with contrast.  It revealed large hepatic mass there was peritoneal enhancement concerning for carcinomatosis.  He was also noted to have large ascites and sternal fracture consistent with metastatic disease.  Finally also had bilateral adrenal metastatic disease. He also underwent abdominal paracentesis both for diagnostic and therapeutic purposes by Dr. Thornton Papas.  He was able to remove 4.1 L of fluid without any difficulty.  Fluid was cloudy yellow in color.  Results are reviewed under lab data.     Past Medical History:  Diagnosis Date  .  Hepatocellular carcinoma diagnosed in March 2019 now metastatic to bones and adrenal glands   . High cholesterol   . 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  Past Surgical History:  Procedure Laterality Date  . BACK SURGERY on 3 different occasions.    . IR RADIOLOGIST EVAL & MGMT  01/12/2018    Prior to Admission medications   Medication Sig Start Date End Date Taking? Authorizing Provider  atorvastatin (LIPITOR) 40 MG tablet Take 40 mg by mouth daily.   Yes [provider]  clopidogrel (PLAVIX) 75 MG tablet Take 75 mg by mouth daily.   Yes [provider]  diphenoxylate-atropine (LOMOTIL) 2.5-0.025 MG tablet TAKE 2 TABLET BY MOUTH FOUR TIMES  DAILY AS NEEDED FOR DIARRHEA 07/12/18  Yes Lockamy, Randi L, NP-C  ondansetron (ZOFRAN) 8 MG tablet Take 1 tablet (8 mg total) by mouth every 8 (eight) hours as needed for nausea or vomiting. 01/14/18  Yes Derek Jack, MD  oxyCODONE (ROXICODONE) 15 MG immediate release tablet Take 1 tablet (15 mg total) by mouth every 6 (six) hours as needed for pain. 02/23/18  Yes Derek Jack, MD  prochlorperazine (COMPAZINE) 10 MG tablet Take 1 tablet (10 mg total) by mouth every 6 (six) hours as needed for nausea or  vomiting. 01/14/18  Yes Derek Jack, MD    Current Facility-Administered Medications  Medication Dose Route Frequency Provider Last Rate Last Dose  . [START ON 09/30/2018] clopidogrel (PLAVIX) tablet 75 mg  75 mg Oral Daily Barton Dubois, MD      . lactated ringers infusion   Intravenous Continuous Barton Dubois, MD 150 mL/hr at 09/29/18 [phone]    . morphine 4 MG/ML injection 4 mg  4 mg Intravenous Once Barton Dubois, MD      . oxyCODONE (Oxy IR/ROXICODONE) immediate release tablet 10 mg  10 mg Oral Q6H PRN Barton Dubois, MD      . prochlorperazine (COMPAZINE) injection 10 mg  10 mg Intravenous Q6H PRN Barton Dubois, MD       Current Outpatient Medications  Medication Sig Dispense Refill  . atorvastatin (LIPITOR) 40 MG tablet Take 40 mg by mouth daily.    . clopidogrel (PLAVIX) 75 MG tablet Take 75 mg by mouth daily.    . diphenoxylate-atropine (LOMOTIL) 2.5-0.025 MG tablet TAKE 2 TABLET BY MOUTH FOUR TIMES DAILY AS NEEDED FOR DIARRHEA 90 tablet 5  . ondansetron (ZOFRAN) 8 MG tablet Take 1 tablet (8 mg total) by mouth every 8 (eight) hours as needed for nausea or vomiting. 30 tablet 2  . oxyCODONE (ROXICODONE) 15 MG immediate release tablet Take 1 tablet (15 mg total) by mouth every 6 (six) hours as needed for pain. 90 tablet 0  . prochlorperazine (COMPAZINE) 10 MG tablet Take 1 tablet (10 mg total) by mouth every 6 (six) hours as needed for nausea or vomiting. 30 tablet 2    Allergies as of 09/29/2018  . (No Known Allergies)    Family history:  Mother had dementia and died at age 36 after having choked on a sandwich and ended up on ventilatory support.  She also had atrial fibrillation.  Father died of ARDS.  He has COPD.  1 brother had end-stage renal disease and died at age 32.  Another another brother age 29 is in good health.  One sister died of COPD at age 3.  Another sister died of drug overdose.  She had history of non-Hodgkin's lymphoma.  Another sister is 16 years old  in good health and works at regional clinic for GI [employer].  Social history:  He is married.  This is his third marriage.  He has 3 grownup children.  He has son and her daughter from his first marriage and 1 son from second marriage.  He works in [employer] fund[employer] for several years.  He also worked in at [employer] for 3 years.  He is now retired.  He smoked cigarettes for at least 40 years about a pack a day and quit 6 years ago.  He used to drink alcohol daily until 1 year ago.  His wife states he never ended up in emergency room or in the hospital because of alcohol ingestion.   Review of Systems:  See HPI, otherwise normal ROS  Physical Exam: Temp:  [97.5 F (36.4 C)] 97.5 F (36.4 C) (01/22 0815) Pulse Rate:  [75-101] 92 (01/22 1801) Resp:  [8-22] 16 (01/22 1801) BP: (117-150)/(74-104) 130/80 (01/22 1801) SpO2:  [94 %-99 %] 96 % (01/22 1801) Weight:  [73.5 kg] 73.5 kg (01/22 0813)   Thin Caucasian male who is alert and is confused. He has bilateral asterixis. Conjunctiva is pink.  Sclera is mildly icteric. Pupils are equal and reactive to light. Neck is superb.  No adenopathy or thyromegaly noted. He has large tattoo over left side of his chest.  Cardiac exam with regular rhythm normal S1 and S2.  No murmur or gallop noted. Auscultation of lungs reveal vesicular breath sounds bilaterally. Abdomen is is distended but not tense.  Please note that he just underwent abdominal paracenteses.  Puncture site in right lower abdomen covered with dressing.  Flanks are dull.  On palpation abdomen is soft and nontender.  Liver spleen could not be palpated. He does not have peripheral edema or clubbing.   Lab Results: Recent Labs    09/29/18 0843  WBC 4.9  HGB 16.3  HCT 47.1  PLT 90*   BMET Recent Labs    09/29/18 0843  NA 122*  K 4.5  CL 87*  CO2 28  GLUCOSE 156*  BUN 42*  CREATININE 1.70*  CALCIUM 8.4*   LFT Recent Labs    09/29/18 0843  PROT 7.3  ALBUMIN 2.4*  AST 121*   ALT 57*  ALKPHOS 84  BILITOT 3.8*   Ascitic fluid cell count as follows WBC 78 neutrophils 8% lymphs 54 and monocytes/macrophages 38 Gram stain negative for microorganisms. The studies and cultures pending.  Studies/Results: Ct Abdomen Pelvis W Contrast  Result Date: 09/29/2018 CLINICAL DATA:  Known hepatic neoplasm EXAM: CT ABDOMEN AND PELVIS WITH CONTRAST TECHNIQUE: Multidetector CT imaging of the abdomen and pelvis was performed using the standard protocol following bolus administration of intravenous contrast. CONTRAST:  37mL OMNIPAQUE IOHEXOL 300 MG/ML  SOLN COMPARISON:  MRI from 09/16/2018 FINDINGS: Lower chest: Lung bases demonstrate no focal infiltrate or sizable effusion. Sternal fracture is again identified similar to that seen on prior CT of the chest. Hepatobiliary: There is again noted a large centrally necrotic mass lesion measuring approximately 6.6 x 7.0 cm. The overall appearance is similar to that seen on recent MRI examination. No new focal hepatic lesion is seen. Underlying changes of cirrhosis are seen. The gallbladder is within normal limits. Pancreas: Unremarkable. No pancreatic ductal dilatation or surrounding inflammatory changes. Spleen: Normal in size without focal abnormality. Adrenals/Urinary Tract: \Left adrenal lesion is seen and stable in appearance from the prior MRI. Right adrenal gland demonstrates a small 13 mm hypodensity also likely representing metastatic disease similar to that seen on prior CT examination. Kidneys demonstrate no mass lesion. Normal excretion of contrast is seen. The bladder is decompressed by Foley catheter. Stomach/Bowel: The appendix is within normal limits. The large and small bowel demonstrate no obstructive changes. The stomach is decompressed. Mild esophageal varices are noted distally. Vascular/Lymphatic: Aortic atherosclerosis. No enlarged abdominal or pelvic lymph nodes. Reproductive: Prostate is unremarkable. Other: Considerable  ascites is noted. This is increased when compared with the prior CT and MRI. Some mild peritoneal enhancement is noted which may represent some implantation although no dominant lesions are seen. Musculoskeletal: Pathologic fracture is noted in the lower sternum consistent with the known metastatic lesion. Postsurgical changes are noted within the lower lumbar spine. No other  definitive metastatic disease is noted. IMPRESSION: Stable appearing hepatic mass. Some peritoneal enhancement is noted without definitive mass lesions. This may represent some early carcinomatosis. Significant increase in the degree of ascites when compared with previous exams. Sternal fracture consistent with known metastatic disease. Bilateral adrenal metastatic disease. Electronically Signed   By: Inez Catalina M.D.   On: 09/29/2018 11:30   US Paracentesis  Result Date: 09/29/2018 INDICATION: Hepatic mass, ascites, cirrhotic liver with suspected hepatocellular carcinoma EXAM: ULTRASOUND GUIDED DIAGNOSTIC AND THERAPEUTIC PARACENTESIS MEDICATIONS: None. COMPLICATIONS: None immediate. PROCEDURE: Informed written consent was obtained from the patient after a discussion of the risks, benefits and alternatives to treatment. A timeout was performed prior to the initiation of the procedure. Initial ultrasound scanning demonstrates a large amount of ascites within the right lower abdominal quadrant. The right lower abdomen was prepped and draped in the usual sterile fashion. 1% lidocaine with epinephrine was used for local anesthesia. Following this, a 8 French catheter was introduced. An ultrasound image was saved for documentation purposes. The paracentesis was performed. The catheter was removed and a dressing was applied. The patient tolerated the procedure well without immediate post procedural complication. FINDINGS: A total of approximately 4.18 L of cloudy yellow colored ascitic fluid was removed. Samples were sent to the laboratory as  requested by the clinical team. IMPRESSION: Successful ultrasound-guided paracentesis yielding 4.18 liters of peritoneal fluid. Electronically Signed   By: Lavonia Dana M.D.   On: 09/29/2018 16:59   I have reviewed current imaging studies as well as MR from 09/16/2018.  Assessment; Patient is an unfortunate 62 year old Caucasian male with known metastatic hepatocellular carcinoma who has not tolerated lenvatinib well which has now been discontinued who presents with diminished oral intake recent diarrhea which has resolved and now has significant ascites.  He has underlying cirrhosis due to untreated hepatitis C and now he is jaundiced.  Lab data is pertinent for hyponatremia and normal serum ammonia as well as elevated BUN and creatinine serum cortisol level is normal.  He does not have spontaneous bacterial peritonitis based on cell count although cultures are pending.  Acute illness appears to be due to dehydration and progressive loss of hepatic function in the setting of hepatocellular carcinoma and cirrhosis.  He now has developed significant ascites.  Clinically he appears to have hepatic encephalopathy although serum ammonia is normal.  If mental status does not improve with hydration and lactulose he may need head CT.  He does not have any localizing signs.  Hyponatremia would appear to be due to diminished oral intake and recent bout of diarrhea.  Decompensated liver disease/ascites is another risk factor for hyponatremia. Serum sodium was normal 1 month ago and near normal 2 weeks ago.  Therefore I believe it is primarily due to diarrhea and dehydration. There is no indication for antibiotic use at this time. I agree with IV hydration although it would result in reaccumulation of ascites which may have to be re-tapped prior to discharge.  Recommendations;  Will check INR in a.m. Lactulose 10 g p.o. 3 times daily. IV hydration. And will be reevaluated in a.m.   LOS: 0 days   [name]  09/29/2018, 6:33 PM

## 2018-09-29 NOTE — Progress Notes (Addendum)
CRITICAL VALUE ALERT  Critical Value:  Sodium 118  Date & Time Notied:  09/29/18 2118  Provider Notified: Mid Level, Baltazar Najjar  Orders Received/Actions taken: pt started on Normal Saline

## 2018-09-29 NOTE — ED Notes (Signed)
Pt back from CT

## 2018-09-29 NOTE — H&P (Signed)
History and Physical    [name]:[id number] DOB: Jan 26, 1956 DOA: 09/29/2018  Referring MD/NP/PA: Dr. Laverta Baltimore PCP: Patient, No Pcp Per  Patient coming from: Home  Chief Complaint: Abdominal distention, nausea, anorexia, generalized weakness.  HPI: [name] is a 63 y.o. male with a past medical history significant for cholesterol, peripheral arterial disease and metastatic liver cancer; who presented to the emergency department secondary to increased abdominal distention, nausea and feeling weak.  Patient recently taken off chronic previous oral chemotherapy with intention to transition to immunotherapy to further stabilize and palliate his metastatic liver cancer.  According to family members he has been feeling weak, no eating and not drinking properly.  Work-up in the emergency department has demonstrated significant hyponatremia, acute kidney injury and transaminitis.  Patient reported intermittent episode of nausea along with anorexia and significant increase in ascites. He is afebrile, denies chest pain, shortness of breath, dysuria, hematuria, melena, hematochezia, vomiting or having any other complaints.  In the ED CT scan of abdomen and pelvis demonstrated worsening ascites, known metastatic liver cancer respiratory intraperitoneal area and adrenal glands.  Patient received fluid resuscitation and antibiotics.  [facility] has been contacted for further evaluation and management.  Past Medical/Surgical History: Past Medical History:  Diagnosis Date  . Cancer ([place])   . High cholesterol   . PAD (peripheral artery disease) ([employer])     Past Surgical History:  Procedure Laterality Date  . BACK SURGERY    . IR RADIOLOGIST EVAL & MGMT  01/12/2018    Social History:  reports that he quit smoking about 5 years ago. His smoking use included cigarettes. He has a 80.00 pack-year smoking history. He has never used smokeless tobacco. He reports previous alcohol use of about 15.0 standard  drinks of alcohol per week. He reports that he does not use drugs.  Allergies: No Known Allergies  Family History:  Family History  Problem Relation Age of Onset  . Alzheimer's disease Mother   . Cancer Sister        breast    Prior to Admission medications   Medication Sig Start Date End Date Taking? Authorizing Provider  atorvastatin (LIPITOR) 40 MG tablet Take 40 mg by mouth daily.   Yes [provider]  clopidogrel (PLAVIX) 75 MG tablet Take 75 mg by mouth daily.   Yes [provider]  diphenoxylate-atropine (LOMOTIL) 2.5-0.025 MG tablet TAKE 2 TABLET BY MOUTH FOUR TIMES DAILY AS NEEDED FOR DIARRHEA 07/12/18  Yes Lockamy, Randi L, NP-C  ondansetron (ZOFRAN) 8 MG tablet Take 1 tablet (8 mg total) by mouth every 8 (eight) hours as needed for nausea or vomiting. 01/14/18  Yes Derek Jack, MD  oxyCODONE (ROXICODONE) 15 MG immediate release tablet Take 1 tablet (15 mg total) by mouth every 6 (six) hours as needed for pain. 02/23/18  Yes Derek Jack, MD  prochlorperazine (COMPAZINE) 10 MG tablet Take 1 tablet (10 mg total) by mouth every 6 (six) hours as needed for nausea or vomiting. 01/14/18  Yes Derek Jack, MD    Review of Systems:  Negative except as otherwise mentioned in HPI.  Physical Exam: Vitals:   09/29/18 1550 09/29/18 1630 09/29/18 1700 09/29/18 1801  BP: 118/82 121/83 123/82 130/80  Pulse: 86 75 75 92  Resp: 10 (!) 9 13 16   Temp:      TempSrc:      SpO2: 96% 96% 96% 96%  Weight:      Height:  Constitutional: Comfortable currently, afebrile, denies chest pain or shortness of breath.  Oriented x2 but confused and having trouble answering question after benzodiazepines given. Eyes: PERRL, lids and conjunctivae normal, no icterus, no nystagmus. ENMT: Mucous membranes are dry on examination. Posterior pharynx clear of any exudate or lesions. Neck: normal, supple, no masses, no thyromegaly, no JVD Respiratory: clear to  auscultation bilaterally, no wheezing, no crackles. Normal respiratory effort. No accessory muscle use.  Cardiovascular: Regular rate and rhythm, no murmurs / rubs / gallops. No extremity edema.  Abdomen: no tenderness, mild distention, no guarding, positive fluid wave from ascitic collection.  Positive bowel sounds appreciated on exam. Musculoskeletal: no clubbing / cyanosis. No joint deformity upper and lower extremities. Good ROM, no contractures. Normal muscle tone.  Skin: no rashes, lesions, ulcers. No induration Neurologic: CN 2-12 grossly intact. Sensation intact, DTR normal. Strength 4/5 in all 4.  Psychiatric: Mood is stable; insight/judgment affected after receiving medication and in the setting of acute ongoing process.   Labs on Admission: I have personally reviewed the following labs and imaging studies  CBC: Recent Labs  Lab 09/29/18 0843  WBC 4.9  NEUTROABS 3.3  HGB 16.3  HCT 47.1  MCV 101.1*  PLT 90*   Basic Metabolic Panel: Recent Labs  Lab 09/29/18 0843  NA 122*  K 4.5  CL 87*  CO2 28  GLUCOSE 156*  BUN 42*  CREATININE 1.70*  CALCIUM 8.4*   GFR: Estimated Creatinine Clearance: 45.6 mL/min (A) (by C-G formula based on SCr of 1.7 mg/dL (H)).   Liver Function Tests: Recent Labs  Lab 09/29/18 0843  AST 121*  ALT 57*  ALKPHOS 84  BILITOT 3.8*  PROT 7.3  ALBUMIN 2.4*   Recent Labs  Lab 09/29/18 0843  LIPASE 42   Recent Labs  Lab 09/29/18 0843  AMMONIA 16   Cardiac Enzymes: Recent Labs  Lab 09/29/18 0843  TROPONINI <0.03   Thyroid Function Tests: Recent Labs    09/29/18 1246  TSH 11.498*   Urine analysis:    Component Value Date/Time   COLORURINE AMBER (A) 09/29/2018 0829   APPEARANCEUR HAZY (A) 09/29/2018 0829   LABSPEC 1.026 09/29/2018 0829   PHURINE 5.0 09/29/2018 0829   GLUCOSEU NEGATIVE 09/29/2018 0829   HGBUR MODERATE (A) 09/29/2018 0829   BILIRUBINUR NEGATIVE 09/29/2018 0829   KETONESUR NEGATIVE 09/29/2018 0829    PROTEINUR >=300 (A) 09/29/2018 0829   NITRITE NEGATIVE 09/29/2018 0829   LEUKOCYTESUR NEGATIVE 09/29/2018 0829   Radiological Exams on Admission: Ct Abdomen Pelvis W Contrast  Result Date: 09/29/2018 CLINICAL DATA:  Known hepatic neoplasm EXAM: CT ABDOMEN AND PELVIS WITH CONTRAST TECHNIQUE: Multidetector CT imaging of the abdomen and pelvis was performed using the standard protocol following bolus administration of intravenous contrast. CONTRAST:  40mL OMNIPAQUE IOHEXOL 300 MG/ML  SOLN COMPARISON:  MRI from 09/16/2018 FINDINGS: Lower chest: Lung bases demonstrate no focal infiltrate or sizable effusion. Sternal fracture is again identified similar to that seen on prior CT of the chest. Hepatobiliary: There is again noted a large centrally necrotic mass lesion measuring approximately 6.6 x 7.0 cm. The overall appearance is similar to that seen on recent MRI examination. No new focal hepatic lesion is seen. Underlying changes of cirrhosis are seen. The gallbladder is within normal limits. Pancreas: Unremarkable. No pancreatic ductal dilatation or surrounding inflammatory changes. Spleen: Normal in size without focal abnormality. Adrenals/Urinary Tract: \Left adrenal lesion is seen and stable in appearance from the prior MRI. Right adrenal gland demonstrates a  small 13 mm hypodensity also likely representing metastatic disease similar to that seen on prior CT examination. Kidneys demonstrate no mass lesion. Normal excretion of contrast is seen. The bladder is decompressed by Foley catheter. Stomach/Bowel: The appendix is within normal limits. The large and small bowel demonstrate no obstructive changes. The stomach is decompressed. Mild esophageal varices are noted distally. Vascular/Lymphatic: Aortic atherosclerosis. No enlarged abdominal or pelvic lymph nodes. Reproductive: Prostate is unremarkable. Other: Considerable ascites is noted. This is increased when compared with the prior CT and MRI. Some mild  peritoneal enhancement is noted which may represent some implantation although no dominant lesions are seen. Musculoskeletal: Pathologic fracture is noted in the lower sternum consistent with the known metastatic lesion. Postsurgical changes are noted within the lower lumbar spine. No other definitive metastatic disease is noted. IMPRESSION: Stable appearing hepatic mass. Some peritoneal enhancement is noted without definitive mass lesions. This may represent some early carcinomatosis. Significant increase in the degree of ascites when compared with previous exams. Sternal fracture consistent with known metastatic disease. Bilateral adrenal metastatic disease. Electronically Signed   By: Inez Catalina M.D.   On: 09/29/2018 11:30   US Paracentesis  Result Date: 09/29/2018 INDICATION: Hepatic mass, ascites, cirrhotic liver with suspected hepatocellular carcinoma EXAM: ULTRASOUND GUIDED DIAGNOSTIC AND THERAPEUTIC PARACENTESIS MEDICATIONS: None. COMPLICATIONS: None immediate. PROCEDURE: Informed written consent was obtained from the patient after a discussion of the risks, benefits and alternatives to treatment. A timeout was performed prior to the initiation of the procedure. Initial ultrasound scanning demonstrates a large amount of ascites within the right lower abdominal quadrant. The right lower abdomen was prepped and draped in the usual sterile fashion. 1% lidocaine with epinephrine was used for local anesthesia. Following this, a 8 French catheter was introduced. An ultrasound image was saved for documentation purposes. The paracentesis was performed. The catheter was removed and a dressing was applied. The patient tolerated the procedure well without immediate post procedural complication. FINDINGS: A total of approximately 4.18 L of cloudy yellow colored ascitic fluid was removed. Samples were sent to the laboratory as requested by the clinical team. IMPRESSION: Successful ultrasound-guided paracentesis  yielding 4.18 liters of peritoneal fluid. Electronically Signed   By: Lavonia Dana M.D.   On: 09/29/2018 16:59    EKG: Independently reviewed.  Normal sinus rhythm, no acute ischemic changes.  Low voltage EKG.  Borderline QT prolongation.  Assessment/Plan 1-acute hyponatremia: In the setting of dehydration and poor oral intake.  Patient also with underlying history of cirrhosis. -Last blood work couple weeks ago demonstrated normal sodium level. -Will initiate fluid resuscitation and follow trend. -Check also cortisol level and TSH. -Patient was actively on chemotherapeutic agents that can cause as a side effect hyponatremia. -at this time holding chemotherapy. -Follow electrolytes trend.  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 -For now holding further chemotherapy agents -GI has been consulted -Paracentesis has been ordered -No signs of acute infection process.  Will hold on antibiotics. -Palliative care consulted. -Started on lactulose per GI recommendations -Continue supportive care, fluid resuscitation, as needed antiemetics.  3-Goals of care, counseling/discussion -As mentioned above palliative care has been consulted -For now full code.  4-High cholesterol -Holding statins in the setting of transaminitis. -Follow LFTs.  5-Transaminitis -In the setting of metastatic hepatocarcinoma, acute dehydration and continue use of statins -A statin has been held -Follow oncology service as  part of further recommendations on treatment/evaluation of ongoing liver cancer. -Fluid resuscitation as mentioned above. -Follow trend  6-AKI (acute kidney injury) ([place]) -In the setting of prerenal azotemia from dehydration and poor oral intake. -Provide fluid resuscitation -Follow renal function  trend.  7-peripheral arterial disease -Continue Plavix -Holding statins as mentioned above in the setting of acute transaminitis.  8-metabolic encephalopathy -In the setting of hyponatremia most likely -Provide electrolytes repletion/fluid resuscitation -Follow trend -Minimize the use of sedative agents -As mentioned by GI despite normal ammonia level will initiate treatment with lactulose.  DVT prophylaxis: SCDs Code Status: Full code Family Communication: Sister and wife at bedside. Disposition Plan: To be determined; hopefully stable to go back home at time of discharge. Consults called: Palliative care, oncology service, gastroenterology service. Admission status: Inpatient, length of stay more than 2 midnights; MedSurg.   Time Spent: 65 minutes  Barton Dubois MD [facility] Pager [phone]  If 7PM-7AM, please contact night-coverage [email or web address]  09/29/2018, 6:24 PM

## 2018-09-29 NOTE — ED Notes (Signed)
Pt to CT

## 2018-09-29 NOTE — ED Notes (Signed)
Pt to paracentesis.

## 2018-09-29 NOTE — ED Triage Notes (Signed)
Pt reports has liver cancer and was recently on a chemo pill.  Pt reports abd pain, nausea, and sob.

## 2018-09-29 NOTE — ED Provider Notes (Signed)
Emergency Department Provider Note   I have reviewed the triage vital signs and the nursing notes.   HISTORY  Chief Complaint Abdominal Pain   HPI [name] is a 63 y.o. male with PMH of HLD, PAD, and metastatic hepatocellular carcinoma presents to the emergency department for evaluation of abdominal pain, poor appetite, shortness of breath.  Patient states that he discontinued his oral chemotherapy, Lenvatinib, last week and feels like symptoms are worsening since that time.  He is due to start Nivolumab next Friday according to family.  He is followed by Dr. Delton Coombes here at [facility].  He denies any fevers or chills.  He is taking his pain medication sparingly because he does not want to be on them.  He reports associated constipation and dribbling urination.  Patient also notes some shortness of breath but has a difficult time characterizing exactly when it started or any associated factors.  He denies chest pain other than his known pathologic fracture of the sternum.  His family at bedside states that he has been more confused recently.    Past Medical History:  Diagnosis Date  . Cancer ([place])   . High cholesterol   . PAD (peripheral artery disease) [facility])     Patient Active Problem List   Diagnosis Date Noted  . Acute hyponatremia 09/29/2018  . Goals of care, counseling/discussion 09/20/2018  . Metastatic hepatocellular carcinoma to bone ([place]) 01/07/2018    Past Surgical History:  Procedure Laterality Date  . BACK SURGERY    . IR RADIOLOGIST EVAL & MGMT  01/12/2018   Allergies Patient has no known allergies.  Family History  Problem Relation Age of Onset  . Alzheimer's disease Mother   . Cancer Sister        breast    Social History Social History   Tobacco Use  . Smoking status: Former Smoker    Packs/day: 2.00    Years: 40.00    Pack years: 80.00    Types: Cigarettes    Last attempt to quit: 12/09/2012    Years since quitting: 5.8  . Smokeless tobacco:  Never Used  Substance Use Topics  . Alcohol use: Not Currently    Alcohol/week: 15.0 standard drinks    Types: 15 Cans of beer per week    Comment: 6 3times a week  . Drug use: Never    Review of Systems  Constitutional: No fever/chills. Positive fatigue.  Eyes: No visual changes. ENT: No sore throat. Cardiovascular: Positive chest pain. Respiratory: Positive shortness of breath. Gastrointestinal: Positive abdominal pain. Positive early satiety. No nausea, no vomiting.  No diarrhea.  No constipation. Genitourinary: Negative for dysuria. Positive decreased urine output.  Musculoskeletal: Negative for back pain. Skin: Negative for rash. Neurological: Negative for headaches, focal weakness or numbness.  10-point ROS otherwise negative.  ____________________________________________   PHYSICAL EXAM:  VITAL SIGNS: ED Triage Vitals  Enc Vitals Group     BP 09/29/18 0815 (!) 145/104     Pulse Rate 09/29/18 0815 93     Resp 09/29/18 0815 18     Temp 09/29/18 0815 (!) 97.5 F (36.4 C)     Temp Source 09/29/18 0815 Oral     SpO2 09/29/18 0815 94 %     Weight 09/29/18 0813 162 lb (73.5 kg)     Height 09/29/18 0813 6\' 1"  (1.854 m)     Pain Score 09/29/18 0813 8   Constitutional: Alert and oriented. Chronically ill-appearing. No acute distress.  Eyes: Conjunctivae  are normal. Icteric sclera noted.  Head: Atraumatic. Nose: No congestion/rhinnorhea. Mouth/Throat: Mucous membranes are dry.  Neck: No stridor.  Cardiovascular: Normal rate, regular rhythm. Good peripheral circulation. Grossly normal heart sounds.   Respiratory: Normal respiratory effort.  No retractions. Lungs CTAB. Gastrointestinal: Soft with mild diffuse tenderness. Positive distended, tense abdomen.  Musculoskeletal: No lower extremity tenderness nor edema. No gross deformities of extremities. Neurologic:  Normal speech and language. No gross focal neurologic deficits are appreciated.  Skin:  Skin is warm, dry  and intact. No rash noted.  ____________________________________________   LABS (all labs ordered are listed, but only abnormal results are displayed)  Labs Reviewed  COMPREHENSIVE METABOLIC PANEL - Abnormal; Notable for the following components:      Result Value   Sodium 122 (*)    Chloride 87 (*)    Glucose, Bld 156 (*)    BUN 42 (*)    Creatinine, Ser 1.70 (*)    Calcium 8.4 (*)    Albumin 2.4 (*)    AST 121 (*)    ALT 57 (*)    Total Bilirubin 3.8 (*)    GFR calc non Af Amer 42 (*)    GFR calc Af Amer 48 (*)    All other components within normal limits  CBC WITH DIFFERENTIAL/PLATELET - Abnormal; Notable for the following components:   MCV 101.1 (*)    MCH 35.0 (*)    Platelets 90 (*)    All other components within normal limits  URINALYSIS, ROUTINE W REFLEX MICROSCOPIC - Abnormal; Notable for the following components:   Color, Urine AMBER (*)    APPearance HAZY (*)    Hgb urine dipstick MODERATE (*)    Protein, ur >=300 (*)    Bacteria, UA RARE (*)    All other components within normal limits  TSH - Abnormal; Notable for the following components:   TSH 11.498 (*)    All other components within normal limits  AMMONIA  LIPASE, BLOOD  TROPONIN I  CORTISOL  OSMOLALITY, URINE  OSMOLALITY  BASIC METABOLIC PANEL   ____________________________________________  EKG   EKG Interpretation  Date/Time:  Wednesday September 29 2018 09:09:54 EST Ventricular Rate:  86 PR Interval:    QRS Duration: 105 QT Interval:  405 QTC Calculation: 485 R Axis:   43 Text Interpretation:  Accelerated junctional rhythm Low voltage, extremity and precordial leads Borderline prolonged QT interval No STEMI.  Confirmed by Nanda Quinton [phone]) on 09/29/2018 9:35:13 AM       ____________________________________________  RADIOLOGY  Ct Abdomen Pelvis W Contrast  Result Date: 09/29/2018 CLINICAL DATA:  Known hepatic neoplasm EXAM: CT ABDOMEN AND PELVIS WITH CONTRAST TECHNIQUE:  Multidetector CT imaging of the abdomen and pelvis was performed using the standard protocol following bolus administration of intravenous contrast. CONTRAST:  38mL OMNIPAQUE IOHEXOL 300 MG/ML  SOLN COMPARISON:  MRI from 09/16/2018 FINDINGS: Lower chest: Lung bases demonstrate no focal infiltrate or sizable effusion. Sternal fracture is again identified similar to that seen on prior CT of the chest. Hepatobiliary: There is again noted a large centrally necrotic mass lesion measuring approximately 6.6 x 7.0 cm. The overall appearance is similar to that seen on recent MRI examination. No new focal hepatic lesion is seen. Underlying changes of cirrhosis are seen. The gallbladder is within normal limits. Pancreas: Unremarkable. No pancreatic ductal dilatation or surrounding inflammatory changes. Spleen: Normal in size without focal abnormality. Adrenals/Urinary Tract: \Left adrenal lesion is seen and stable in appearance from the prior MRI.  Right adrenal gland demonstrates a small 13 mm hypodensity also likely representing metastatic disease similar to that seen on prior CT examination. Kidneys demonstrate no mass lesion. Normal excretion of contrast is seen. The bladder is decompressed by Foley catheter. Stomach/Bowel: The appendix is within normal limits. The large and small bowel demonstrate no obstructive changes. The stomach is decompressed. Mild esophageal varices are noted distally. Vascular/Lymphatic: Aortic atherosclerosis. No enlarged abdominal or pelvic lymph nodes. Reproductive: Prostate is unremarkable. Other: Considerable ascites is noted. This is increased when compared with the prior CT and MRI. Some mild peritoneal enhancement is noted which may represent some implantation although no dominant lesions are seen. Musculoskeletal: Pathologic fracture is noted in the lower sternum consistent with the known metastatic lesion. Postsurgical changes are noted within the lower lumbar spine. No other definitive  metastatic disease is noted. IMPRESSION: Stable appearing hepatic mass. Some peritoneal enhancement is noted without definitive mass lesions. This may represent some early carcinomatosis. Significant increase in the degree of ascites when compared with previous exams. Sternal fracture consistent with known metastatic disease. Bilateral adrenal metastatic disease. Electronically Signed   By: Inez Catalina M.D.   On: 09/29/2018 11:30    ____________________________________________   PROCEDURES  Procedure(s) performed:   Procedures  None ____________________________________________   INITIAL IMPRESSION / ASSESSMENT AND PLAN / ED COURSE  Pertinent labs & imaging results that were available during my care of the patient were reviewed by me and considered in my medical decision making (see chart for details).  Patient with metastatic hepatocellular carcinoma currently on palliative chemotherapy presents with abdominal pain, distention, early satiety, decreased urine output, and shortness of breath.  Patient is awake and alert but family reports some confusion.  I will add ammonia in addition to baseline labs, IV fluids, pain medication.  No significant nausea vomiting complaints at this time.  Plan to reach out to oncology team to discuss further imaging PRN.   Spoke with oncology regarding the case. They will consult during admit. Patient with hyponatremia and worsening ascites on CT. No other acute findings. Patient with slight elevated creatinine. Suspect pre-renal cause and dehydration clinically. Plan for IVF infusion and admit.   Discussed patient's case with Hospitalist to request admission. Patient and family (if present) updated with plan. Care transferred to Hospitalist service.  I reviewed all nursing notes, vitals, pertinent old records, EKGs, labs, imaging (as available).  ____________________________________________  FINAL CLINICAL IMPRESSION(S) / ED DIAGNOSES  Final  diagnoses:  Hyponatremia  Generalized abdominal pain    MEDICATIONS GIVEN DURING THIS VISIT:  Medications  morphine 4 MG/ML injection 4 mg (4 mg Intravenous Not Given 09/29/18 0909)  lactated ringers infusion ( Intravenous New Bag/Given 09/29/18 0952)  sodium chloride 0.9 % bolus 1,000 mL (0 mLs Intravenous Stopped 09/29/18 0952)  morphine 2 MG/ML injection (4 mg Intravenous Given 09/29/18 0850)  iohexol (OMNIPAQUE) 300 MG/ML solution 75 mL (75 mLs Intravenous Contrast Given 09/29/18 1101)  LORazepam (ATIVAN) injection 0.5 mg (0.5 mg Intravenous Given 09/29/18 1142)    Note:  This document was prepared using Dragon voice recognition software and may include unintentional dictation errors.  Nanda Quinton, MD Emergency Medicine    Neave Lenger, [facility], MD 09/29/18 [phone]

## 2018-09-29 NOTE — ED Notes (Signed)
Pt wanted to walk to the bathroom.

## 2018-09-30 DIAGNOSIS — Z66 Do not resuscitate: Secondary | ICD-10-CM

## 2018-09-30 DIAGNOSIS — R1084 Generalized abdominal pain: Secondary | ICD-10-CM

## 2018-09-30 DIAGNOSIS — C220 Liver cell carcinoma: Secondary | ICD-10-CM

## 2018-09-30 DIAGNOSIS — Z515 Encounter for palliative care: Secondary | ICD-10-CM

## 2018-09-30 LAB — CBC
HCT: 43.5 % (ref 39.0–52.0)
Hemoglobin: 15.1 g/dL (ref 13.0–17.0)
MCH: 35.5 pg — ABNORMAL HIGH (ref 26.0–34.0)
MCHC: 34.7 g/dL (ref 30.0–36.0)
MCV: 102.4 fL — ABNORMAL HIGH (ref 80.0–100.0)
PLATELETS: 69 10*3/uL — AB (ref 150–400)
RBC: 4.25 MIL/uL (ref 4.22–5.81)
RDW: 15 % (ref 11.5–15.5)
WBC: 4.8 10*3/uL (ref 4.0–10.5)
nRBC: 0 % (ref 0.0–0.2)

## 2018-09-30 LAB — BASIC METABOLIC PANEL
ANION GAP: 6 (ref 5–15)
BUN: 35 mg/dL — ABNORMAL HIGH (ref 8–23)
CO2: 27 mmol/L (ref 22–32)
Calcium: 7.8 mg/dL — ABNORMAL LOW (ref 8.9–10.3)
Chloride: 90 mmol/L — ABNORMAL LOW (ref 98–111)
Creatinine, Ser: 1.59 mg/dL — ABNORMAL HIGH (ref 0.61–1.24)
GFR calc Af Amer: 52 mL/min — ABNORMAL LOW (ref >60)
GFR calc non Af Amer: 45 mL/min — ABNORMAL LOW (ref >60)
Glucose, Bld: 118 mg/dL — ABNORMAL HIGH (ref 70–99)
Potassium: 4.8 mmol/L (ref 3.5–5.1)
Sodium: 123 mmol/L — ABNORMAL LOW (ref 135–145)

## 2018-09-30 LAB — PROTEIN, BODY FLUID (OTHER): TOTAL PROTEIN, BODY FLUID OTHER: 0.5 g/dL

## 2018-09-30 LAB — PROTIME-INR
INR: 1.33
Prothrombin Time: 16.3 seconds — ABNORMAL HIGH (ref 11.4–15.2)

## 2018-09-30 LAB — PH, BODY FLUID: pH, Body Fluid: 8.1

## 2018-09-30 LAB — SODIUM
Sodium: 119 mmol/L — CL (ref 135–145)
Sodium: 121 mmol/L — ABNORMAL LOW (ref 135–145)

## 2018-09-30 LAB — LD, BODY FLUID (OTHER): LD, Body Fluid: 36 IU/L

## 2018-09-30 LAB — OSMOLALITY, URINE: OSMOLALITY UR: 873 mosm/kg (ref 300–900)

## 2018-09-30 MED ORDER — ALPRAZOLAM 0.5 MG PO TABS: 0.5000 mg | ORAL_TABLET | Freq: Every evening | ORAL | Status: DC | PRN

## 2018-09-30 NOTE — Progress Notes (Signed)
CRITICAL VALUE ALERT  Critical Value:  Sodium 119  Date & Time Notied:  09/30/18 0040  Provider Notified: Mid Level, Baltazar Najjar  Orders Received/Actions taken: No new orders at this time

## 2018-09-30 NOTE — Progress Notes (Signed)
Subjective:  Patient states he does not feel well.  He also complains of abdominal distention since he had abdominal tap yesterday.  His wife says he does not like the hospital food.  He has been eating some food from outside.  He had 2 bowel movements today.  No melena or rectal bleeding.  He has not experienced nausea or vomiting.  His wife feels he is less confused than he was yesterday.    Objective: Blood pressure 115/86, pulse 80, temperature (!) 97.5 F (36.4 C), temperature source Oral, resp. rate 16, height '6\' 1"'$  (1.854 m), weight 79.2 kg, SpO2 99 %. Patient is alert. He remains with asterixis. He is oriented to place and person but not to time.  He thinks this November 2018. Sclera remains mildly icteric. Abdomen is distended and somewhat tense.  No tenderness noted. LE edema noted.  Labs/studies Results:  CBC Latest Ref Rng & Units 09/30/2018 09/29/2018 09/16/2018  WBC 4.0 - 10.5 K/uL 4.8 4.9 3.7(L)  Hemoglobin 13.0 - 17.0 g/dL 15.1 16.3 17.5(H)  Hematocrit 39.0 - 52.0 % 43.5 47.1 50.8  Platelets 150 - 400 K/uL 69(L) 90(L) 56(L)    CMP Latest Ref Rng & Units 09/30/2018 09/30/2018 09/29/2018  Glucose 70 - 99 mg/dL - 118(H) -  BUN 8 - 23 mg/dL - 35(H) -  Creatinine 0.61 - 1.24 mg/dL - 1.59(H) -  Sodium 135 - 145 mmol/L 121(L) 123(L) 119(LL)  Potassium 3.5 - 5.1 mmol/L - 4.8 -  Chloride 98 - 111 mmol/L - 90(L) -  CO2 22 - 32 mmol/L - 27 -  Calcium 8.9 - 10.3 mg/dL - 7.8(L) -  Total Protein 6.5 - 8.1 g/dL - - -  Total Bilirubin 0.3 - 1.2 mg/dL - - -  Alkaline Phos 38 - 126 U/L - - -  AST 15 - 41 U/L - - -  ALT 0 - 44 U/L - - -    Hepatic Function Latest Ref Rng & Units 09/29/2018 09/16/2018 08/13/2018  Total Protein 6.5 - 8.1 g/dL 7.3 6.9 7.4  Albumin 3.5 - 5.0 g/dL 2.4(L) 2.5(L) 2.6(L)  AST 15 - 41 U/L 121(H) 98(H) 76(H)  ALT 0 - 44 U/L 57(H) 40 47(H)  Alk Phosphatase 38 - 126 U/L 84 76 90  Total Bilirubin 0.3 - 1.2 mg/dL 3.8(H) 2.6(H) 2.3(H)  Bilirubin, Direct 0.1 - 0.5  mg/dL - - -    INR 1.33.  Ascitic fluid cultures negative at 24 hours.    Assessment:  #1.  Mental status changes.  Mental status changes appear to be multifactorial.  He possibly has combination of metabolic and hepatic encephalopathy even though his ammonia is normal.  It remains to be seen if he would return to his baseline.  #2.  Ascites.  Suspect ascites primarily due to advanced cirrhosis but he could also have peritoneal mets given that he already has mets to other sites.  Cytology is pending. Ascites is rapidly reaccumulating with IV fluids.  He will need to be tapped again in a.m.  #3.  Hyponatremia.  Hyponatremia once again appears to be multifactorial.  He remains on normal saline.  Serum sodium up from 1 18-1 23 from this morning.  #4.  Azotemia.  Urine and serum creatinine trending down with IV hydration.  #5.  Metastatic hepatocellular carcinoma.  I am not sure if patient will be able to tolerate we will able to tolerate nivolumab since hepatic function has deteriorated.  Significantly in the last few weeks.  Recommendations:  Continue lactulose at low dose. Metabolic 7 in a.m. Abdominal paracentesis in a.m.

## 2018-09-30 NOTE — Progress Notes (Signed)
PROGRESS NOTE    [name]  QQI:[id number] DOB: 1956-06-01 DOA: 09/29/2018 PCP: Patient, No Pcp Per     Brief Narrative:  63 y.o. male with a past medical history significant for cholesterol, peripheral arterial disease and metastatic liver cancer; who presented to the emergency department secondary to increased abdominal distention, nausea and feeling weak.  Patient recently taken off chronic previous oral chemotherapy with intention to transition to immunotherapy to further stabilize and palliate his metastatic liver cancer.  According to family members he has been feeling weak, no eating and not drinking properly.  Work-up in the emergency department has demonstrated significant hyponatremia, acute kidney injury and transaminitis.  Patient reported intermittent episode of nausea along with anorexia and significant increase in ascites. He is afebrile, denies chest pain, shortness of breath, dysuria, hematuria, melena, hematochezia, vomiting or having any other complaints.   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 -patient DNR/DNI  3-High cholesterol -continue holding statins due to transaminitis  4-Transaminitis -in the setting of worsening cirrhosis and liver cancer -continue holding statins  5-Ascites -status post paracentesis -cytology pending -rapid re-accumulation seen with IVF's -had 4L removed on 1/22  6-AKI (acute kidney injury) ([place]) -in the setting of pre-renal azotemia -will continue IVF's for now -follow trend   7-PAD -continue plavix  8-encephalopathy -multifactorial as well -will continue lactulose -minimize sedative agents -continue  hydration and electrolytes repletion.  DVT prophylaxis: SCD's Code Status: DNR/DNI Family Communication: wife at bedside Disposition Plan: remains inpatient. Continue IVF's, follow electrolytes. Advance diet as tolerated. Will most likely pursuit repeat paracentesis in am.   Consultants:   GI  Oncology service.  Procedures:   Paracentesis on 1/22  See below for x-ray reports   Antimicrobials:  Anti-infectives (From admission, onward)   None       Subjective: Afebrile, no CP, no SOB. Reports mild nausea, no vomiting. Oral intake still poor.   Objective: Vitals:   09/29/18 2136 09/30/18 0500 09/30/18 0615 09/30/18 1637  BP: 108/72  117/84 115/86  Pulse: 82  88 80  Resp: 16  16 16   Temp: (!) 97.5 F (36.4 C)  (!) 97.4 F (36.3 C) (!) 97.5 F (36.4 C)  TempSrc: Oral  Oral Oral  SpO2: 94%  96% 99%  Weight:  79.2 kg    Height:        Intake/Output Summary (Last 24 hours) at 09/30/2018 1729 Last data filed at 09/30/2018 1225 Gross per 24 hour  Intake 845.71 ml  Output 650 ml  Net 195.71 ml   Filed Weights   09/29/18 0813 09/30/18 0500  Weight: 73.5 kg 79.2 kg    Examination: General exam: Alert, awake, oriented x 2; reports some improvement in oral intake, but still poor overall. Per family at bedside, mentation not at baseline yet. Some jaundice appreciated on exam. Respiratory system: Clear to auscultation. Respiratory effort normal. Cardiovascular system:RRR. No murmurs, rubs, gallops. Gastrointestinal system: Abdomen is distended, with positive ascites and nontender. Normal bowel sounds heard. Central nervous system: Alert and oriented X2. No focal neurological deficits. Extremities: No C/C/E, +pedal pulses Skin: No rashes, lesions or ulcers Psychiatry: Judgement and insight appear impair by encephalopathy.  Data Reviewed: I have personally reviewed following labs and imaging studies  CBC: Recent Labs  Lab 09/29/18  3474 09/30/18 0413  WBC 4.9 4.8    NEUTROABS 3.3  --   HGB 16.3 15.1  HCT 47.1 43.5  MCV 101.1* 102.4*  PLT 90* 69*   Basic Metabolic Panel: Recent Labs  Lab 09/29/18 0843 09/29/18 1954 09/29/18 2336 09/30/18 0413 09/30/18 0907  NA 122* 118* 119* 123* 121*  K 4.5 4.6  --  4.8  --   CL 87* 88*  --  90*  --   CO2 28 22  --  27  --   GLUCOSE 156* 109*  --  118*  --   BUN 42* 37*  --  35*  --   CREATININE 1.70* 1.44*  --  1.59*  --   CALCIUM 8.4* 7.9*  --  7.8*  --    GFR: Estimated Creatinine Clearance: 52.6 mL/min (A) (by C-G formula based on SCr of 1.59 mg/dL (H)).   Liver Function Tests: Recent Labs  Lab 09/29/18 0843  AST 121*  ALT 57*  ALKPHOS 84  BILITOT 3.8*  PROT 7.3  ALBUMIN 2.4*   Recent Labs  Lab 09/29/18 0843  LIPASE 42   Recent Labs  Lab 09/29/18 0843  AMMONIA 16   Coagulation Profile: Recent Labs  Lab 09/30/18 0413  INR 1.33   Cardiac Enzymes: Recent Labs  Lab 09/29/18 0843  TROPONINI <0.03   Thyroid Function Tests: Recent Labs    09/29/18 1246  TSH 11.498*   Urine analysis:    Component Value Date/Time   COLORURINE AMBER (A) 09/29/2018 0829   APPEARANCEUR HAZY (A) 09/29/2018 0829   LABSPEC 1.026 09/29/2018 0829   PHURINE 5.0 09/29/2018 0829   GLUCOSEU NEGATIVE 09/29/2018 0829   HGBUR MODERATE (A) 09/29/2018 0829   BILIRUBINUR NEGATIVE 09/29/2018 0829   KETONESUR NEGATIVE 09/29/2018 0829   PROTEINUR >=300 (A) 09/29/2018 0829   NITRITE NEGATIVE 09/29/2018 0829   LEUKOCYTESUR NEGATIVE 09/29/2018 0829    Recent Results (from the past 240 hour(s))  Gram stain     Status: None   Collection Time: 09/29/18  4:30 PM  Result Value Ref Range Status   Specimen Description ASCITIC  Final   Special Requests NONE  Final   Gram Stain   Final    NO ORGANISMS SEEN Performed at [place], PREDOMINANTLY MONONUCLEAR Performed at [facility], [address] [place], [place] [address]    Report Status 09/29/2018 FINAL  Final   Culture, body fluid-bottle     Status: None (Preliminary result)   Collection Time: 09/29/18  4:30 PM  Result Value Ref Range Status   Specimen Description ASCITIC  Final   Special Requests BOTTLES DRAWN AEROBIC AND ANAEROBIC 10CC EACH  Final   Culture   Final    NO GROWTH < 24 HOURS Performed at [facility], [address], [place], [place] [address]    Report Status PENDING  Incomplete     Radiology Studies: Ct Abdomen Pelvis W Contrast  Result Date: 09/29/2018 CLINICAL DATA:  Known hepatic neoplasm EXAM: CT ABDOMEN AND PELVIS WITH CONTRAST TECHNIQUE: Multidetector CT imaging of the abdomen and pelvis was performed using the standard protocol following bolus administration of intravenous contrast. CONTRAST:  82mL OMNIPAQUE IOHEXOL 300 MG/ML  SOLN COMPARISON:  MRI from 09/16/2018 FINDINGS: Lower chest: Lung bases demonstrate no focal infiltrate or sizable effusion. Sternal fracture is again identified similar to that seen on prior CT of the chest. Hepatobiliary: There is again noted a large centrally necrotic mass lesion measuring approximately 6.6  x 7.0 cm. The overall appearance is similar to that seen on recent MRI examination. No new focal hepatic lesion is seen. Underlying changes of cirrhosis are seen. The gallbladder is within normal limits. Pancreas: Unremarkable. No pancreatic ductal dilatation or surrounding inflammatory changes. Spleen: Normal in size without focal abnormality. Adrenals/Urinary Tract: \Left adrenal lesion is seen and stable in appearance from the prior MRI. Right adrenal gland demonstrates a small 13 mm hypodensity also likely representing metastatic disease similar to that seen on prior CT examination. Kidneys demonstrate no mass lesion. Normal excretion of contrast is seen. The bladder is decompressed by Foley catheter. Stomach/Bowel: The appendix is within normal limits. The large and small bowel demonstrate no obstructive changes. The stomach is decompressed. Mild  esophageal varices are noted distally. Vascular/Lymphatic: Aortic atherosclerosis. No enlarged abdominal or pelvic lymph nodes. Reproductive: Prostate is unremarkable. Other: Considerable ascites is noted. This is increased when compared with the prior CT and MRI. Some mild peritoneal enhancement is noted which may represent some implantation although no dominant lesions are seen. Musculoskeletal: Pathologic fracture is noted in the lower sternum consistent with the known metastatic lesion. Postsurgical changes are noted within the lower lumbar spine. No other definitive metastatic disease is noted. IMPRESSION: Stable appearing hepatic mass. Some peritoneal enhancement is noted without definitive mass lesions. This may represent some early carcinomatosis. Significant increase in the degree of ascites when compared with previous exams. Sternal fracture consistent with known metastatic disease. Bilateral adrenal metastatic disease. Electronically Signed   By: Inez Catalina M.D.   On: 09/29/2018 11:30   US Paracentesis  Result Date: 09/29/2018 INDICATION: Hepatic mass, ascites, cirrhotic liver with suspected hepatocellular carcinoma EXAM: ULTRASOUND GUIDED DIAGNOSTIC AND THERAPEUTIC PARACENTESIS MEDICATIONS: None. COMPLICATIONS: None immediate. PROCEDURE: Informed written consent was obtained from the patient after a discussion of the risks, benefits and alternatives to treatment. A timeout was performed prior to the initiation of the procedure. Initial ultrasound scanning demonstrates a large amount of ascites within the right lower abdominal quadrant. The right lower abdomen was prepped and draped in the usual sterile fashion. 1% lidocaine with epinephrine was used for local anesthesia. Following this, a 8 French catheter was introduced. An ultrasound image was saved for documentation purposes. The paracentesis was performed. The catheter was removed and a dressing was applied. The patient tolerated the procedure  well without immediate post procedural complication. FINDINGS: A total of approximately 4.18 L of cloudy yellow colored ascitic fluid was removed. Samples were sent to the laboratory as requested by the clinical team. IMPRESSION: Successful ultrasound-guided paracentesis yielding 4.18 liters of peritoneal fluid. Electronically Signed   By: Lavonia Dana M.D.   On: 09/29/2018 16:59        Scheduled Meds: . clopidogrel  75 mg Oral Daily  . feeding supplement (ENSURE ENLIVE)  237 mL Oral BID BM  . lactulose  10 g Oral TID   Continuous Infusions: . sodium chloride 75 mL/hr at 09/30/18 1124     LOS: 1 day    Time spent: 30 minutes.   Barton Dubois, MD [facility] Pager [phone]  If 7PM-7AM, please contact night-coverage [email or web address] Password [facility] 09/30/2018, 5:29 PM

## 2018-09-30 NOTE — Consult Note (Signed)
Consultation Note Date: 09/30/2018   Patient Name: [name]  DOB: 07/25/1956  MRN: [id number]  Age / Sex: 63 y.o., male  PCP: Patient, No Pcp Per Referring Physician: Barton Dubois, MD  Reason for Consultation: Establishing goals of care  HPI/Patient Profile: 63 y.o. male  with past medical history of hepatitis C, liver cirrhosis, metastatic hepatocellular carcinoma, PAD, HLD admitted on 09/29/2018 with abdominal distention, anorexia, nausea, and generalized weakness. Recently discontinued chemotherapy with oncology plan to start immunotherapy. In ED, patient found to have significant hyponatremia, acute kidney injury, and transaminitis. CT scan demonstrated worsening ascites, known metastatic liver cancer with known mets to sternum, bilateral adrenal glands, and peritoneal enhancement without definitive mass lesions, possibly representing early carcinomatosis. S/p paracentesis with 4L removed. GI following. Pending oncology inpatient consult. Palliative medicine consultation for goals of care and advance directives.   Clinical Assessment and Goals of Care:  I have reviewed medical records, discussed with care team, and met with patient and wife [name]) at bedside to discuss diagnosis, GOC, EOL wishes, disposition and options.  Introduced Palliative Medicine as specialized medical care for people living with serious illness. It focuses on providing relief from the symptoms and stress of a serious illness. The goal is to improve quality of life for both the patient and the family.  We discussed a brief life review of the patient. [name] and [name] have been married for 20 years. 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   Discussed journey with cancer since diagnosis in March 2019. He has completed radiation and recently stopped chemotherapy with plans to start immunotherapy next week. Since  [name]'s diagnosis, he has remained as active as possible including remodeling their kitchen and building a new deck. He speaks of wanting to get things taken care of for [name].   Discussed events leading up to admission and course of hospitalization including diagnoses and interventions. Explored [name]'s understanding of his cancer diagnosis and conversations with his oncologist. He speaks of having "6 months left" and understanding his cancer is not curable. "I'm going to die from it." We discussed treatment being palliative in nature. [name] becomes tearful during the conversation. He shares that he has been wanting to discuss expectations for end-of-life.   I attempted to elicit values and goals of care important to the patient. Advanced directives, concepts specific to code status, artifical feeding and hydration, and rehospitalization were considered and discussed.[name] asks about a "DNR." Extensively discussed importance of completing advance directive/living will documentation. [name] are interested in completing AD packet with spiritual department. [name] wishes for [name] to be primary HCPOA and his triplet sister Karna Moon) to serve as secondary HCPOA.   Introduced and completed MOST form with patient and wife. Sayan shares that he wishes to "die peacefully." He does NOT want resuscitation attempted when he does die. He states "no life prolonging interventions." Further discussed medical recommendation against life-prolonging interventions including resuscitation/life support with an underlying incurable condition. [name]'s wishes including DNR/DNI, limited interventions, IVF and antibiotics if  indicated, and NO feeding tube.   He speaks of wanting to be comfortable at the end of his life. He asked if he would be "starved to death." Explained EOL expectations with nutrition as well as focus on comfort feeds. He does not want to die in a hospital.   Introduced hospice philosophy and options.  Explained role of comfort, quality, peace and dignity at EOL. Also emphasis on symptom management and preventing re-current hospitalization.   At this point, [name] is hopeful he will still be a candidate to try immunotherapy. [name] and [name] are interested in outpatient hospice services IF oncology determines there are no further therapy options.   The difference between aggressive medical intervention and comfort care was discussed.   Questions and concerns were addressed.  Hard Choices and Gone from my Sight booklet left for review.   Therapeutic listening and emotional support provided. Patient is hopeful he can complete more projects at the house for his wife before the end of his life.    SUMMARY OF RECOMMENDATIONS    Great GOC discussion with patient and wife [name]).   Patient interested in completing AD packet. He wishes for wife as primary HCPOA and sister as secondary HCPOA. Spiritual consult placed.   Discussed and completed MOST form. Patient's wishes include: DNR/DNI, limited interventions including BiPAP/CPAP if necessary and re-hospitalization, IVF and ABX if indicated, NO feeding tube. Durable DNR completed. Copies made for chart and family.   Patient understands his cancer is incurable and treatment is palliative in nature. He wishes to pursue immunotherapy if still offered by oncology. If he is not a candidate for further oncology interventions, patient and wife are interested in hospice services at home. Pending inpatient oncology consult.   May require intermittent outpatient paracentesis to maintain comfort and help with symptom management.   Code Status/Advance Care Planning:  DNR  Symptom Management:   Per attending  Palliative Prophylaxis:   Bowel Regimen, Frequent Pain Assessment and Oral Care  Psycho-social/Spiritual:   Desire for further Chaplaincy support:yes  Additional Recommendations: Caregiving  Support/Resources, Compassionate Wean Education  and Education on Hospice  Prognosis:   Unable to determine: Poor long-term with metastatic hepatocellular carcinoma, ascites, and declining functional/nutritional status.   Discharge Planning: To Be Determined      Primary Diagnoses: Present on Admission: . Acute hyponatremia . 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 High cholesterol . Transaminitis . Ascites . AKI (acute kidney injury) ([place])   I have reviewed the medical record, interviewed the patient and family, and examined the patient. The following aspects are pertinent.  Past Medical History:  Diagnosis Date  . Cancer ([place])   . High cholesterol   . PAD (peripheral artery disease) ([facility])    Social History   Socioeconomic History  . Marital status: Married    Spouse name: Not on file  . Number of children: Not on file  . Years of education: Not on file  . Highest education level: Not on file  Occupational History  . Not on file  Social Needs  . Financial resource strain: Not on file  . Food insecurity:    Worry: Not on file    Inability: Not on file  . Transportation needs:    Medical: Not on file    Non-medical: Not on file  Tobacco Use  . Smoking status: Former Smoker    Packs/day: 2.00    Years: 40.00    Pack years: 80.00    Types: Cigarettes  Last attempt to quit: 12/09/2012    Years since quitting: 5.8  . Smokeless tobacco: Never Used  Substance and Sexual Activity  . Alcohol use: Not Currently    Alcohol/week: 15.0 standard drinks    Types: 15 Cans of beer per week    Comment: 6 3times a week  . Drug use: Never  . Sexual activity: Not on file  Lifestyle  . Physical activity:    Days per week: Not on file    Minutes per session: Not on file  . Stress: Not on file  Relationships  . Social connections:    Talks on phone: Not on file    Gets together: Not on file    Attends religious service: Not on file    Active member of club or organization: Not on file    Attends meetings  of clubs or organizations: Not on file    Relationship status: Not on file  Other Topics Concern  . Not on file  Social History Narrative  . Not on file   Family History  Problem Relation Age of Onset  . Alzheimer's disease Mother   . Cancer Sister        breast   Scheduled Meds: . clopidogrel  75 mg Oral Daily  . feeding supplement (ENSURE ENLIVE)  237 mL Oral BID BM  . lactulose  10 g Oral TID  . morphine  4 mg Intravenous Once   Continuous Infusions: . sodium chloride 75 mL/hr at 09/30/18 1124   PRN Meds:.oxyCODONE, prochlorperazine Medications Prior to Admission:  Prior to Admission medications   Medication Sig Start Date End Date Taking? Authorizing Provider  atorvastatin (LIPITOR) 40 MG tablet Take 40 mg by mouth daily.   Yes [provider]  clopidogrel (PLAVIX) 75 MG tablet Take 75 mg by mouth daily.   Yes [provider]  diphenoxylate-atropine (LOMOTIL) 2.5-0.025 MG tablet TAKE 2 TABLET BY MOUTH FOUR TIMES DAILY AS NEEDED FOR DIARRHEA 07/12/18  Yes Lockamy, Randi L, NP-C  ondansetron (ZOFRAN) 8 MG tablet Take 1 tablet (8 mg total) by mouth every 8 (eight) hours as needed for nausea or vomiting. 01/14/18  Yes Derek Jack, MD  oxyCODONE (ROXICODONE) 15 MG immediate release tablet Take 1 tablet (15 mg total) by mouth every 6 (six) hours as needed for pain. 02/23/18  Yes Derek Jack, MD  prochlorperazine (COMPAZINE) 10 MG tablet Take 1 tablet (10 mg total) by mouth every 6 (six) hours as needed for nausea or vomiting. 01/14/18  Yes Derek Jack, MD   No Known Allergies Review of Systems  Constitutional: Positive for activity change, appetite change and unexpected weight change.  Gastrointestinal: Positive for abdominal distention.  Neurological: Positive for weakness.   Physical Exam Vitals signs and nursing note reviewed.  Constitutional:      General: He is awake.  HENT:     Head: Normocephalic and atraumatic.  Pulmonary:      Effort: No tachypnea, accessory muscle usage or respiratory distress.  Abdominal:     General: There is distension.     Tenderness: There is no abdominal tenderness.  Skin:    General: Skin is warm and dry.     Coloration: Skin is jaundiced.  Neurological:     Mental Status: He is alert and oriented to person, place, and time.  Psychiatric:        Mood and Affect: Mood normal.        Speech: Speech normal.  Behavior: Behavior is cooperative.        Cognition and Memory: Cognition normal.    Vital Signs: BP 117/84 (BP Location: Left Arm)   Pulse 88   Temp (!) 97.4 F (36.3 C) (Oral)   Resp 16   Ht '6\' 1"'$  (1.854 m)   Wt 79.2 kg   SpO2 96%   BMI 23.04 kg/m  Pain Scale: 0-10   Pain Score: 0-No pain   SpO2: SpO2: 96 % O2 Device:SpO2: 96 % O2 Flow Rate: .   IO: Intake/output summary:   Intake/Output Summary (Last 24 hours) at 09/30/2018 1427 Last data filed at 09/30/2018 1225 Gross per 24 hour  Intake 845.71 ml  Output 650 ml  Net 195.71 ml    LBM: Last BM Date: (pt unable to state) Baseline Weight: Weight: 73.5 kg Most recent weight: Weight: 79.2 kg     Palliative Assessment/Data: PPS 50%   Flowsheet Rows     Most Recent Value  Intake Tab  Referral Department  Hospitalist  Unit at Time of Referral  Med/Surg Unit  Palliative Care Primary Diagnosis  Cancer  Palliative Care Type  New Palliative care  Reason for referral  Clarify Goals of Care, Advance Care Planning  Date first seen by Palliative Care  09/30/18  Clinical Assessment  Palliative Performance Scale Score  50%  Psychosocial & Spiritual Assessment  Palliative Care Outcomes  Patient/Family meeting held?  Yes  Who was at the meeting?  patient and wife  Palliative Care Outcomes  Clarified goals of care, Provided end of life care assistance, Provided psychosocial or spiritual support, ACP counseling assistance, Counseled regarding hospice, Provided advance care planning, Completed durable DNR,  Changed CPR status      Time In: 0945 Time Out: 1115 Time Total: 22mn Greater than 50%  of this time was spent counseling and coordinating care related to the above assessment and plan.  Signed by:  MIhor Dow DNP, FNP-C Palliative Medicine Team  Phone: [phone]: [phone]  Please contact Palliative Medicine Team phone at [phone] questions and concerns.  For individual provider: See AShea Evans

## 2018-09-30 NOTE — Progress Notes (Signed)
Initial Nutrition Assessment  DOCUMENTATION CODES:   Severe malnutrition in context of chronic illness  INTERVENTION:  Ensure Enlive po BID, each supplement provides 350 kcal and 20 grams of protein   ProStat 30 ml TID (each 30 ml provides 100 kcal, 15 gr protein)   Small meals or oral supplements 5-6 times daily- to prevent hypoglycemia  Supplement B-complex vitamins and synthetic folicin  Omega-3   NUTRITION DIAGNOSIS:   Severe Malnutrition(Liver cirrhosis (ascites), metastatic hepatocellular carcinoma, hepatitis C.) related to altered GI function, cancer and cancer related treatments, chronic illness, poor appetite as evidenced by energy intake < or equal to 75% for > or equal to 1 month, per patient/family report, severe muscle depletion, severe fat depletion.   GOAL:   Patient will meet greater than or equal to 90% of their needs  MONITOR:   PO intake, Supplement acceptance, Labs, Weight trends  REASON FOR ASSESSMENT:   Malnutrition Screening Tool    ASSESSMENT: Patient is a 63 yo male with hx of cirrhosis (ascites), metastatic hepatocellular carcinoma, hepatitis C. Presents with complaint abdominal distention, anorexia, nausea and increased weakness.Patient had been taking oral chemotherapy but has stopped and plans are for palliative immunotherapy. CT findings- large hepatic mass and ascites (4.1 liters removed) by IR. Patient may need additional fluid removed per nurse. Patient affirms multiple times during RD visit, "I just want to go home".  Palliative met with patient and his spouse to review goals of care. MOST form completed-No feeding tube desired should pt become unable to maintain nutrition orally.  Nutrition intake is good today -75% of breakfast and lunch meals. He LOVES milk and has consumed 4 cartons per pt and ate 1 pancake.  However patient and spouse report very poor oral intake at home. He has been drinking Gatorade and occasional Ensure. No  multivitamin. Wife says she has been giving him magnesium for cramps.   Patient weight has been ranging between 76-79 kg the past 7 months. Unclear at this time his dry weight following removal of 4 liters earlier today. Asked nurse to obtain a follow up weight is pt has more fluid removed today. Patient says he has been weighing at home and reports value of 161 lb (73 kg). Based on this he is 12 lb above UBW - not factoring in the difference in types and brands of scales. Fully expect patient has lost muscle and fat mass due to his poor intake.  Patient says he has been able to walk around in the house some but not feels very weak. He has severe upper body muscle and fat loss.  Labs: hyponatremia BMP Latest Ref Rng & Units 09/30/2018 09/30/2018 09/29/2018  Glucose 70 - 99 mg/dL - 118(H) -  BUN 8 - 23 mg/dL - 35(H) -  Creatinine 0.61 - 1.24 mg/dL - 1.59(H) -  Sodium 135 - 145 mmol/L 121(L) 123(L) 119(LL)  Potassium 3.5 - 5.1 mmol/L - 4.8 -  Chloride 98 - 111 mmol/L - 90(L) -  CO2 22 - 32 mmol/L - 27 -  Calcium 8.9 - 10.3 mg/dL - 7.8(L) -     NUTRITION - FOCUSED PHYSICAL EXAM:    Most Recent Value  Upper Arm Region  Moderate depletion  Buccal Region  Severe depletion  Temple Region  Severe depletion  Clavicle Bone Region  Severe depletion  Clavicle and Acromion Bone Region  Severe depletion  Eyes  Reviewed  Mouth  Reviewed  Skin  Reviewed      Diet Order:  Diet Order            Diet regular Room service appropriate? Yes; Fluid consistency: Thin  Diet effective now              EDUCATION NEEDS:  Education needs have been addressed Skin:  Skin Assessment: Reviewed RN Assessment  Last BM:  unknown  Height:   Ht Readings from Last 1 Encounters:  09/29/18 '6\' 1"'$  (1.854 m)    Weight:   Wt Readings from Last 1 Encounters:  09/30/18 79.2 kg    Ideal Body Weight:  84 kg  BMI:  Body mass index is 23.04 kg/m.  Estimated Nutritional Needs:   Kcal:  2500-2600 (~35  kcal/kg/ibw)(based on IBW-84 kg due to fluid status)  Protein:  109-126 (1.3-1.5 gr/kg/ibw)  Fluid:  per MD goals   Colman Cater MS,RD,CSG,LDN Office: [phone] Pager: [phone]

## 2018-10-01 ENCOUNTER — Inpatient Hospital Stay (HOSPITAL_COMMUNITY): Payer: BLUE CROSS/BLUE SHIELD

## 2018-10-01 DIAGNOSIS — E43 Unspecified severe protein-calorie malnutrition: Secondary | ICD-10-CM

## 2018-10-01 MED ORDER — OXYCODONE HCL 5 MG PO TABS: 15.0000 mg | ORAL_TABLET | Freq: Four times a day (QID) | ORAL | Status: DC | PRN

## 2018-10-01 MED ORDER — ALPRAZOLAM 0.5 MG PO TABS: 0.5000 mg | ORAL_TABLET | Freq: Three times a day (TID) | ORAL | Status: DC | PRN

## 2018-10-01 MED ORDER — ALBUMIN HUMAN 25 % IV SOLN
50.0000 g | Freq: Four times a day (QID) | INTRAVENOUS | Status: AC
  Administered 2018-10-01 – 2018-10-02 (×3): 50 g via INTRAVENOUS
  Filled 2018-10-01: qty 100
  Filled 2018-10-01: qty 50
  Filled 2018-10-01: qty 100
  Filled 2018-10-01: qty 200
  Filled 2018-10-01 (×2): qty 100

## 2018-10-01 MED ORDER — ALPRAZOLAM 0.5 MG PO TABS: 0.5000 mg | ORAL_TABLET | Freq: Two times a day (BID) | ORAL | Status: DC | PRN

## 2018-10-01 MED ORDER — ALPRAZOLAM 0.25 MG PO TABS: 0.2500 mg | ORAL_TABLET | Freq: Two times a day (BID) | ORAL | Status: DC | PRN

## 2018-10-01 MED ORDER — OXYCODONE HCL 5 MG PO TABS
10.0000 mg | ORAL_TABLET | ORAL | Status: DC | PRN
  Administered 2018-10-02: 10 mg via ORAL
  Filled 2018-10-01: qty 2

## 2018-10-01 MED ORDER — RIFAXIMIN 550 MG PO TABS
550.0000 mg | ORAL_TABLET | Freq: Two times a day (BID) | ORAL | Status: DC
  Administered 2018-10-01: 550 mg via ORAL
  Filled 2018-10-01: qty 1

## 2018-10-01 MED ORDER — ALPRAZOLAM 0.25 MG PO TABS: 0.2500 mg | ORAL_TABLET | Freq: Three times a day (TID) | ORAL | Status: DC | PRN

## 2018-10-01 MED ORDER — ADULT MULTIVITAMIN W/MINERALS CH: 1.0000 | ORAL_TABLET | Freq: Every day | ORAL | Status: DC

## 2018-10-01 MED ORDER — PRO-STAT SUGAR FREE PO LIQD
30.0000 mL | Freq: Three times a day (TID) | ORAL | Status: DC
  Administered 2018-10-01: 30 mL via ORAL
  Filled 2018-10-01: qty 30

## 2018-10-01 MED ORDER — HYDROMORPHONE HCL 1 MG/ML IJ SOLN
0.5000 mg | INTRAMUSCULAR | Status: DC | PRN
  Administered 2018-10-02: 0.5 mg via INTRAVENOUS
  Filled 2018-10-01: qty 0.5

## 2018-10-01 NOTE — Consult Note (Signed)
Patient admitted with severe weakness, abdominal distention and poor oral intake.  He was found to be severely hyponatremic and confused at times.  He did have paracentesis x2 done, the last one today with 5 L of fluid taken out.  He is currently receiving albumin.  Creatinine also elevated most likely from hepatorenal syndrome.  He is deteriorating rapidly. -I had a prolonged discussion with the wife and the patient.  I have strongly recommended palliative care with hospice at this time.  Patient and wife are agreeable.  They prefer [facility]. I have talked to Dr. Carmelina Noun who will make arrangements for him to go home with hospice tomorrow.

## 2018-10-01 NOTE — Progress Notes (Signed)
Paracentesis complete no signs of distress.  

## 2018-10-01 NOTE — Progress Notes (Signed)
PROGRESS NOTE    [name]  HOZ:[id number] DOB: [birth year] DOA: 09/29/2018 PCP: Patient, No Pcp Per     Brief Narrative:  63 y.o. male with a past medical history significant for cholesterol, peripheral arterial disease and metastatic liver cancer; who presented to the emergency department secondary to increased abdominal distention, nausea and feeling weak.  Patient recently taken off chronic previous oral chemotherapy with intention to transition to immunotherapy to further stabilize and palliate his metastatic liver cancer.  According to family members he has been feeling weak, no eating and not drinking properly.  Work-up in the emergency department has demonstrated significant hyponatremia, acute kidney injury and transaminitis.  Patient reported intermittent episode of nausea along with anorexia and significant increase in ascites. He is afebrile, denies chest pain, shortness of breath, dysuria, hematuria, melena, hematochezia, vomiting or having any other complaints.   Assessment & Plan: 1-Acute hyponatremia -multifactorial  -associated with poor oral intake, recent chemotherapy and most likely worsening cirrhosis and ascites. -continue IVF's; but adjust rate. -sodium level not worse at this moment.  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 -patient DNR/DNI  3-High cholesterol -continue holding statins due to transaminitis  4-Transaminitis -in the setting of worsening cirrhosis and liver cancer -continue holding statins  5-Ascites -status post paracentesis -cytology pending -rapid re-accumulation seen with IVF's -had 4L removed on 1/22 -Repeat paracentesis today (1/24) yielded 5 L of ascitic fluid. -Will give 4 doses of albumin.  6-AKI (acute kidney injury) ([place]) -in the setting of pre-renal azotemia and with concerns for potential hepatorenal  syndrome. -Continue IV fluids but decrease rate -follow trend  -Minimize the use of nephrotoxic agents.  7-PAD -continue plavix  8-encephalopathy -multifactorial as well -will continue lactulose -minimize sedative agents -continue hydration and electrolytes repletion as needed.  DVT prophylaxis: SCD's Code Status: DNR/DNI Family Communication: wife at bedside Disposition Plan: remains inpatient. Continue IVF's, follow electrolytes. Advance diet as tolerated. Will most likely pursuit repeat paracentesis in am.   Consultants:   GI  Oncology service.  Palliative care  Procedures:   Paracentesis on 1/22 (4L)  Repeat Paracentesis 1/24 (5L)  See below for x-ray reports   Antimicrobials:  Anti-infectives (From admission, onward)   None       Subjective: Afebrile, no chest pain, no shortness of breath.  Reporting abdominal discomfort, anorexia, and severe abdominal distention from ascites reaccumulation.  Objective: Vitals:   09/30/18 2207 10/01/18 0550 10/01/18 1420 10/01/18 1503  BP: 113/76 111/75 117/81 113/76  Pulse: 93 92 96 92  Resp: 17 16 16 18   Temp: 98.2 F (36.8 C) 98.1 F (36.7 C)    TempSrc: Oral Oral    SpO2: 96% 95% 97% 98%  Weight:      Height:        Intake/Output Summary (Last 24 hours) at 10/01/2018 1622 Last data filed at 10/01/2018 1400 Gross per 24 hour  Intake 2772.82 ml  Output 250 ml  Net 2522.82 ml   Filed Weights   09/29/18 0813 09/30/18 0500  Weight: 73.5 kg 79.2 kg    Examination: General exam: Alert, awake, oriented x 2; positive jaundice, no chest pain, complaining of abdominal pain and feeling nauseous. Respiratory system: Clear to auscultation. Respiratory effort normal. Cardiovascular system:RRR. No murmurs, rubs, gallops. Gastrointestinal system: Abdomen is distended, tender on palpation and with positive fluid wave secondary to ascites.  Positive bowel sounds. Central nervous system: Alert and oriented. No focal  neurological deficits. Extremities: No C/C/E, +pedal pulses Skin: No rashes, lesions or ulcers Psychiatry: Judgement and insight appear normal. Mood & affect appropriate.    Data Reviewed: I have personally reviewed following labs and imaging studies  CBC: Recent Labs  Lab 09/29/18 0843 09/30/18 0413  WBC 4.9 4.8  NEUTROABS 3.3  --   HGB 16.3 15.1  HCT 47.1 43.5  MCV 101.1* 102.4*  PLT 90* 69*   Basic Metabolic Panel: Recent Labs  Lab 09/29/18 0843 09/29/18 1954 09/29/18 2336 09/30/18 0413 09/30/18 0907  NA 122* 118* 119* 123* 121*  K 4.5 4.6  --  4.8  --   CL 87* 88*  --  90*  --   CO2 28 22  --  27  --   GLUCOSE 156* 109*  --  118*  --   BUN 42* 37*  --  35*  --   CREATININE 1.70* 1.44*  --  1.59*  --   CALCIUM 8.4* 7.9*  --  7.8*  --    GFR: Estimated Creatinine Clearance: 52.6 mL/min (A) (by C-G formula based on SCr of 1.59 mg/dL (H)).   Liver Function Tests: Recent Labs  Lab 09/29/18 0843  AST 121*  ALT 57*  ALKPHOS 84  BILITOT 3.8*  PROT 7.3  ALBUMIN 2.4*   Recent Labs  Lab 09/29/18 0843  LIPASE 42   Recent Labs  Lab 09/29/18 0843  AMMONIA 16   Coagulation Profile: Recent Labs  Lab 09/30/18 0413  INR 1.33   Cardiac Enzymes: Recent Labs  Lab 09/29/18 0843  TROPONINI <0.03   Thyroid Function Tests: Recent Labs    09/29/18 1246  TSH 11.498*   Urine analysis:    Component Value Date/Time   COLORURINE AMBER (A) 09/29/2018 0829   APPEARANCEUR HAZY (A) 09/29/2018 0829   LABSPEC 1.026 09/29/2018 0829   PHURINE 5.0 09/29/2018 0829   GLUCOSEU NEGATIVE 09/29/2018 0829   HGBUR MODERATE (A) 09/29/2018 0829   BILIRUBINUR NEGATIVE 09/29/2018 0829   KETONESUR NEGATIVE 09/29/2018 0829   PROTEINUR >=300 (A) 09/29/2018 0829   NITRITE NEGATIVE 09/29/2018 0829   LEUKOCYTESUR NEGATIVE 09/29/2018 0829    Recent Results (from the past 240 hour(s))  Gram stain     Status: None   Collection Time: 09/29/18  4:30 PM  Result Value Ref Range  Status   Specimen Description ASCITIC  Final   Special Requests NONE  Final   Gram Stain   Final    NO ORGANISMS SEEN Performed at [facility], PREDOMINANTLY MONONUCLEAR Performed at [facility], [address], [place], [place] [address]    Report Status 09/29/2018 FINAL  Final  Culture, body fluid-bottle     Status: None (Preliminary result)   Collection Time: 09/29/18  4:30 PM  Result Value Ref Range Status   Specimen Description ASCITIC  Final   Special Requests BOTTLES DRAWN AEROBIC AND ANAEROBIC 10CC EACH  Final   Culture   Final    NO GROWTH 2 DAYS Performed at [facility], [address] [place], [place] [address]    Report Status PENDING  Incomplete     Radiology Studies: US Paracentesis  Result Date: 10/01/2018 INDICATION: Metastatic hepatocellular carcinoma, cirrhosis, ascites EXAM: ULTRASOUND GUIDED THERAPEUTIC PARACENTESIS MEDICATIONS: None. COMPLICATIONS: None immediate. PROCEDURE: Procedure, benefits, and risks of procedure were discussed with patient. Written informed consent for procedure was obtained. Time out protocol followed. Adequate collection of ascites localized by ultrasound in RIGHT lower quadrant. Skin prepped  and draped in usual sterile fashion. Skin and soft tissues anesthetized with 10 mL of 1% lidocaine. 5 [place] Yueh catheter placed into peritoneal cavity. 5 L of yellow ascitic fluid aspirated by vacuum bottle suction. Procedure tolerated well by patient without immediate complication. FINDINGS: As above IMPRESSION: Successful ultrasound-guided paracentesis yielding 5 liters of peritoneal fluid. Electronically Signed   By: Lavonia Dana M.D.   On: 10/01/2018 15:22   US Paracentesis  Result Date: 09/29/2018 INDICATION: Hepatic mass, ascites, cirrhotic liver with suspected hepatocellular carcinoma EXAM: ULTRASOUND GUIDED DIAGNOSTIC AND THERAPEUTIC PARACENTESIS MEDICATIONS: None. COMPLICATIONS: None immediate. PROCEDURE:  Informed written consent was obtained from the patient after a discussion of the risks, benefits and alternatives to treatment. A timeout was performed prior to the initiation of the procedure. Initial ultrasound scanning demonstrates a large amount of ascites within the right lower abdominal quadrant. The right lower abdomen was prepped and draped in the usual sterile fashion. 1% lidocaine with epinephrine was used for local anesthesia. Following this, a 8 French catheter was introduced. An ultrasound image was saved for documentation purposes. The paracentesis was performed. The catheter was removed and a dressing was applied. The patient tolerated the procedure well without immediate post procedural complication. FINDINGS: A total of approximately 4.18 L of cloudy yellow colored ascitic fluid was removed. Samples were sent to the laboratory as requested by the clinical team. IMPRESSION: Successful ultrasound-guided paracentesis yielding 4.18 liters of peritoneal fluid. Electronically Signed   By: Lavonia Dana M.D.   On: 09/29/2018 16:59   Scheduled Meds: . clopidogrel  75 mg Oral Daily  . feeding supplement (ENSURE ENLIVE)  237 mL Oral BID BM  . feeding supplement (PRO-STAT SUGAR FREE 64)  30 mL Oral TID  . lactulose  10 g Oral TID  . multivitamin with minerals  1 tablet Oral Daily   Continuous Infusions: . sodium chloride 50 mL/hr at 10/01/18 1326  . albumin human 50 g (10/01/18 1547)     LOS: 2 days    Time spent: 30 minutes.   Barton Dubois, MD [facility] Pager [phone]  If 7PM-7AM, please contact night-coverage [email or web address] Password [email or web address] 10/01/2018, 4:22 PM

## 2018-10-01 NOTE — Progress Notes (Addendum)
Daily Progress Note   Patient Name: [name]       Date: 10/01/2018 DOB: [birth year]  Age: 63 y.o. MRN#: [id number] Attending Physician: Barton Dubois, MD Primary Care Physician: Patient, No Pcp Per Admit Date: 09/29/2018  Reason for Consultation/Follow-up: Establishing goals of care  Subjective: 1300: Patient is awake but frustrated. He wants to go home. "I can come back if I need to." He is complaining of abdominal pain. Wife and sister at bedside waiting on possible paracentesis today.   ADDENDUM 1615: Received call from RN to f/u with patient and wife who just spoke with oncology regarding prognosis and recommendation for hospice with progressive liver failure/poor candidacy for further oncology interventions.   F/u with patient and wife at bedside. Appropriately tearful. Discussed course of hospitalization, diagnoses, interventions, and their conversation with oncology. Discussed hospice philosophy and options, with emphasis on comfort, quality, dignity, and symptom management at EOL.   Wife, [name], wishes to take [name] home with hospice services and hopeful to take him home tomorrow. Patient is eager to be discharged as soon as possible. He is at peace with EOL, knowing this cancer was terminal but shares that he is more worried about his wife and sister when he does die. Emotional support provided.   Discussed symptom management. Patient still complaining of mild pain despite paracentesis and removal of 5L. Made medications adjustments and encouraged patient to take prn oxycodone every four hours if needed for pain.   Therapeutic listening and emotional support provided. [name] has notified his children of poor prognosis and hopeful they will visit with him before he passes.    Length of Stay: 2  Current Medications: Scheduled Meds:  . clopidogrel  75 mg Oral Daily  . feeding supplement (ENSURE ENLIVE)  237 mL Oral BID BM  . feeding supplement (PRO-STAT SUGAR FREE 64)  30 mL Oral TID  . lactulose  10 g Oral TID  . multivitamin with minerals  1 tablet Oral Daily    Continuous Infusions: . sodium chloride 50 mL/hr at 10/01/18 1326  . albumin human      PRN Meds: ALPRAZolam, oxyCODONE, prochlorperazine  Physical Exam Vitals signs and nursing note reviewed.  Constitutional:      General: He is awake.  HENT:     Head: Normocephalic and atraumatic.  Pulmonary:  Effort: No tachypnea, accessory muscle usage or respiratory distress.  Abdominal:     General: There is distension.     Tenderness: There is abdominal tenderness.     Comments: ascites  Skin:    Coloration: Skin is jaundiced.  Neurological:     Mental Status: He is alert and oriented to person, place, and time.  Psychiatric:        Speech: Speech normal.        Cognition and Memory: Cognition normal.     Comments: irritable            Vital Signs: BP 117/81 (BP Location: Left Arm)   Pulse 96   Temp 98.1 F (36.7 C) (Oral)   Resp 16   Ht 6\' 1"  (1.854 m)   Wt 79.2 kg   SpO2 97%   BMI 23.04 kg/m  SpO2: SpO2: 97 % O2 Device: O2 Device: Room Air O2 Flow Rate:    Intake/output summary:   Intake/Output Summary (Last 24 hours) at 10/01/2018 1434 Last data filed at 10/01/2018 1400 Gross per 24 hour  Intake 2772.82 ml  Output 250 ml  Net 2522.82 ml   LBM: Last BM Date: 09/30/18 Baseline Weight: Weight: 73.5 kg Most recent weight: Weight: 79.2 kg       Palliative Assessment/Data: PPS 50%   Flowsheet Rows     Most Recent Value  Intake Tab  Referral Department  Hospitalist  Unit at Time of Referral  Med/Surg Unit  Palliative Care Primary Diagnosis  Cancer  Date Notified  09/29/18  Palliative Care Type  New Palliative care  Reason for referral  Clarify Goals of Care,  Advance Care Planning  Date of Admission  09/29/18  Date first seen by Palliative Care  09/30/18  # of days Palliative referral response time  1 Day(s)  # of days IP prior to Palliative referral  0  Clinical Assessment  Palliative Performance Scale Score  50%  Psychosocial & Spiritual Assessment  Palliative Care Outcomes  Patient/Family meeting held?  Yes  Who was at the meeting?  patient and wife  Palliative Care Outcomes  Clarified goals of care, Provided end of life care assistance, Provided psychosocial or spiritual support, ACP counseling assistance, Counseled regarding hospice, Provided advance care planning, Completed durable DNR, Changed CPR status      Patient Active Problem List   Diagnosis Date Noted  . Protein-calorie malnutrition, severe 10/01/2018  . Palliative care by specialist   . DNR (do not resuscitate)   . Hepatocellular carcinoma ([place])   . Generalized abdominal pain   . Acute hyponatremia 09/29/2018  . High cholesterol 09/29/2018  . Transaminitis 09/29/2018  . Ascites 09/29/2018  . AKI (acute kidney injury) ([place]) 09/29/2018  . Goals of care, counseling/discussion 09/20/2018  . 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 male  with past medical history of hepatitis C, liver cirrhosis, metastatic hepatocellular carcinoma, PAD, HLD admitted on 09/29/2018 with abdominal distention, anorexia, nausea, and generalized weakness. Recently discontinued chemotherapy with oncology plan to start immunotherapy. In ED, patient found to have significant hyponatremia, acute kidney injury, and transaminitis. CT scan demonstrated worsening ascites, known metastatic liver cancer with known mets to sternum, bilateral adrenal glands, and peritoneal enhancement without definitive mass lesions, possibly representing early carcinomatosis. S/p paracentesis with 4L removed. GI following. Pending oncology  inpatient consult. Palliative medicine consultation for goals of care and advance directives.   Assessment: Metastatic  hepatocellular carcinoma Acute hyponatremia Ascites Transaminitis  Recommendations/Plan:  MOST form completed 09/30/18 with patient and wife. Patient wishes include: DNR/DNI, limited interventions including re-hospitalization, IVF and antibiotics if indicated, and no feeding tube.   Patient and wife understand his cancer is incurable and treatment is palliative in nature. Patient wishes to pursue immunotherapy if offered by oncology. Pending oncology decision regarding immunotherapy. If he is not a candidate for further oncology interventions, patient/wife interested in hospice services at home.   Pending spiritual care consult to complete AD packet.   Repeat paracentesis today per GI.    ADDENDUM 1615: Patient and wife have spoke with oncology. Due to progressive liver cancer, cirrhosis, recurrent ascites and liver failure, patient is no longer a candidate for oncology interventions. Patient and wife wish to discharge home with support of hospice services. RN CM notified. They live in [place]. Medication adjustments made. Oxycodone 10mg  q4h prn pain/dyspnea. Dilaudid 0.5mg  IV q4h prn severe pain/dyspnea. Xanax 0.5mg  PO TID prn anxiety/sleep. MOST form completed. Patient is a DNR/DNI and transition to comfort measures/hospice services on discharge.   Code Status: DNR/DNI   Code Status Orders  (From admission, onward)         Start     Ordered   09/30/18 1056  Do not attempt resuscitation (DNR)  Continuous    Question Answer Comment  In the event of cardiac or respiratory ARREST Do not call a "code blue"   In the event of cardiac or respiratory ARREST Do not perform Intubation, CPR, defibrillation or ACLS   In the event of cardiac or respiratory ARREST Use medication by any route, position, wound care, and other measures to relive pain and suffering. May use oxygen,  suction and manual treatment of airway obstruction as needed for comfort.      09/30/18 1055        Code Status History    Date Active Date Inactive Code Status Order ID Comments User Context   09/29/2018 1826 09/30/2018 1055 Full Code [id number]  Barton Dubois, MD ED       Prognosis:  Poor prognosis likely weeks-days: Metastatic hepatocellular carcinoma, liver cirrhosis, recurrent ascites, declining functional/nutritional status.  Discharge Planning:  Home with Hospice  Care plan was discussed with patient, wife, RN, Dr. Dyann Kief  Thank you for allowing the Palliative Medicine Team to assist in the care of this patient.   Time In: 1255- 1615 Time Out: 1310 1700 Total Time 60 Prolonged Time Billed yes      Greater than 50%  of this time was spent counseling and coordinating care related to the above assessment and plan.  Ihor Dow, FNP-C Palliative Medicine Team  Phone: [phone] Fax: [phone]  Please contact Palliative Medicine Team phone at [phone] for questions and concerns.

## 2018-10-01 NOTE — Procedures (Signed)
PreOperative Dx: Cirrhosis, ascites, HCC Postoperative Dx: Cirrhosis, ascites, HCC Procedure:   US guided paracentesis Radiologist:  Thornton Papas Anesthesia:  10 ml of1% lidocaine Specimen:  5 L of yellow ascitic fluid EBL:   < 1 ml Complications: None

## 2018-10-01 NOTE — Progress Notes (Signed)
 Subjective:  Patient continues to feel poorly.  He complains of mid and upper abdominal pain.  According to his wife he has had very few bites of food.  He has no appetite.   Objective: Blood pressure 113/76, pulse 92, temperature 98.1 F (36.7 C), temperature source Oral, resp. rate 18, height 6' 1" (1.854 m), weight 79.2 kg, SpO2 98 %. Patient is alert.  He is in no acute distress. He is not oriented to date month or year. Asterixis present. Sclera is mildly icteric. Abdomen is distended but not tense.  He has mild tenderness in periumbilical region epigastrium and right upper quadrant.  Please note that he just had 5 L of yellow fluid removed. He does not have peripheral edema. Generalized muscle wasting.    Labs/studies Results:   CBC Latest Ref Rng & Units 09/30/2018 09/29/2018 09/16/2018  WBC 4.0 - 10.5 K/uL 4.8 4.9 3.7(L)  Hemoglobin 13.0 - 17.0 g/dL 15.1 16.3 17.5(H)  Hematocrit 39.0 - 52.0 % 43.5 47.1 50.8  Platelets 150 - 400 K/uL 69(L) 90(L) 56(L)    CMP Latest Ref Rng & Units 09/30/2018 09/30/2018 09/29/2018  Glucose 70 - 99 mg/dL - 118(H) -  BUN 8 - 23 mg/dL - 35(H) -  Creatinine 0.61 - 1.24 mg/dL - 1.59(H) -  Sodium 135 - 145 mmol/L 121(L) 123(L) 119(LL)  Potassium 3.5 - 5.1 mmol/L - 4.8 -  Chloride 98 - 111 mmol/L - 90(L) -  CO2 22 - 32 mmol/L - 27 -  Calcium 8.9 - 10.3 mg/dL - 7.8(L) -  Total Protein 6.5 - 8.1 g/dL - - -  Total Bilirubin 0.3 - 1.2 mg/dL - - -  Alkaline Phos 38 - 126 U/L - - -  AST 15 - 41 U/L - - -  ALT 0 - 44 U/L - - -    Hepatic Function Latest Ref Rng & Units 09/29/2018 09/16/2018 08/13/2018  Total Protein 6.5 - 8.1 g/dL 7.3 6.9 7.4  Albumin 3.5 - 5.0 g/dL 2.4(L) 2.5(L) 2.6(L)  AST 15 - 41 U/L 121(H) 98(H) 76(H)  ALT 0 - 44 U/L 57(H) 40 47(H)  Alk Phosphatase 38 - 126 U/L 84 76 90  Total Bilirubin 0.3 - 1.2 mg/dL 3.8(H) 2.6(H) 2.3(H)  Bilirubin, Direct 0.1 - 0.5 mg/dL - - -      Ascitic fluid cultures negative      Assessment:  #1.  Mental status changes.  No significant improvement in mental status changes.  He remains with asterixis.  He does not have focal signs.  I feel he has hepatic encephalopathy in spite of having normal serum ammonia.  He has not responded to lactulose.  It be worth trying him on Xifaxan as well.  He may not respond given very little hepatic reserve.  #2.  Ascites.  Patient underwent abdominal paracenteses 2 days ago and had a repeated today with removal of 5 L of fluid.  Fluid cytology is pending.  His fluid has reaccumulated rather rapidly.  This may be because of IV fluids or he could have peritoneal mets.  I called pathology department at [facility] and cytology is not completed yet.  #3.  Hyponatremia.  Will check serum sodium in a.m.  #4.  Azotemia.  Lab studies not checked today but his BUN and creatinine were down yesterday but not normal.  #5.  Metastatic hepatocellular carcinoma.  Patient has advanced disease.  He has not responded to to VEGF inhibitor.  His condition has rapidly deteriorated as noted   by Dr. Delton Coombes as well.  Therefore he may not be a candidate for any further therapy and he has recommended comfort measures and hospice.  Recommendations:  We will check INR and serum ammonia in a.m. Xifaxan 550 mg by mouth twice daily. Abdominal paracentesis on as-needed basis for symptomatic relief.

## 2018-10-02 DIAGNOSIS — R188 Other ascites: Secondary | ICD-10-CM

## 2018-10-02 DIAGNOSIS — E43 Unspecified severe protein-calorie malnutrition: Secondary | ICD-10-CM

## 2018-10-02 LAB — COMPREHENSIVE METABOLIC PANEL
ALT: 34 U/L (ref 0–44)
AST: 71 U/L — ABNORMAL HIGH (ref 15–41)
Albumin: 3.3 g/dL — ABNORMAL LOW (ref 3.5–5.0)
Alkaline Phosphatase: 51 U/L (ref 38–126)
Anion gap: 8 (ref 5–15)
BUN: 35 mg/dL — ABNORMAL HIGH (ref 8–23)
CO2: 24 mmol/L (ref 22–32)
Calcium: 8.5 mg/dL — ABNORMAL LOW (ref 8.9–10.3)
Chloride: 92 mmol/L — ABNORMAL LOW (ref 98–111)
Creatinine, Ser: 1.35 mg/dL — ABNORMAL HIGH (ref 0.61–1.24)
GFR calc Af Amer: >60 mL/min (ref >60)
GFR calc non Af Amer: 55 mL/min — ABNORMAL LOW (ref >60)
Glucose, Bld: 88 mg/dL (ref 70–99)
Potassium: 4.7 mmol/L (ref 3.5–5.1)
SODIUM: 124 mmol/L — AB (ref 135–145)
Total Bilirubin: 5.3 mg/dL — ABNORMAL HIGH (ref 0.3–1.2)
Total Protein: 6 g/dL — ABNORMAL LOW (ref 6.5–8.1)

## 2018-10-02 LAB — AMMONIA: Ammonia: 22 umol/L (ref 9–35)

## 2018-10-02 LAB — PROTIME-INR
INR: 1.63
Prothrombin Time: 19.1 seconds — ABNORMAL HIGH (ref 11.4–15.2)

## 2018-10-02 MED ORDER — OXYCODONE HCL 15 MG PO TABS: 15.0000 mg | ORAL_TABLET | ORAL | 0 refills | Status: AC | PRN

## 2018-10-02 MED ORDER — RIFAXIMIN 550 MG PO TABS: 550.0000 mg | ORAL_TABLET | Freq: Two times a day (BID) | ORAL | 0 refills | Status: AC

## 2018-10-02 MED ORDER — ALPRAZOLAM 0.5 MG PO TABS: 0.5000 mg | ORAL_TABLET | Freq: Three times a day (TID) | ORAL | 0 refills | Status: AC | PRN

## 2018-10-02 NOTE — Progress Notes (Signed)
Patient discharged home today per MD orders. Patient vital signs WDL. IV removed and site WDL. Discharge Instructions including follow up appointments, medications, and education reviewed with patient. Hospice nurse discussed discharge plan with patient and wife before discharge. Patient and wife verbalizes understanding. Patient is transported out via wheelchair.

## 2018-10-02 NOTE — Progress Notes (Signed)
Patient resting in bed. Patient requested pain  Medication earlier this shift however does not want any of the scheduled medications because the patient states that he is going home today.The patient stated that he is aware he is to be made comfortable and he will talk to the doctor when he arrives. Dr. Dyann Kief sent a message to make aware.

## 2018-10-02 NOTE — Discharge Summary (Signed)
Physician Discharge Summary  [name] RWE:[id number] DOB: 1956-03-29 DOA: 09/29/2018  PCP: Patient, No Pcp Per  Admit date: 09/29/2018 Discharge date: 10/02/2018  Time spent: 35 minutes  Recommendations for Outpatient Follow-up:  1. Patient is now DNR/DNI 2. Plan is for full comfort care and symptomatic management.   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  Discharge home with home hospice.  Diet recommendation: Comfort feeding, watching amount of sodium.  Filed Weights   09/29/18 0813 09/30/18 0500 10/02/18 0552  Weight: 73.5 kg 79.2 kg 76.9 kg    History of present illness:  63 y.o. male with a past medical history significant for cholesterol, peripheral arterial disease and metastatic liver cancer; who presented to the emergency department secondary to increased abdominal distention, nausea and feeling weak.  Patient recently taken off chronic previous oral chemotherapy with intention to transition to immunotherapy to further stabilize and palliate his metastatic liver cancer.  According to family members he has been feeling weak, no eating and not drinking properly.  Work-up in the emergency department has demonstrated significant hyponatremia, acute kidney injury and transaminitis.  Patient reported intermittent episode of nausea along with anorexia and significant increase in ascites. He is afebrile, denies chest pain, shortness of breath, dysuria, hematuria, melena, hematochezia, vomiting or having any other complaints.  In the ED CT scan of abdomen and pelvis demonstrated  worsening ascites, known metastatic liver cancer respiratory intraperitoneal area and adrenal glands.  Patient received fluid resuscitation and antibiotics.  [facility] has been contacted for further evaluation and management.  Hospital Course:  1-acute hyponatremia -Multifactorial: Associated with poor oral intake, dehydration and worsening cirrhosis/ascites -Patient with sudden improvement after fluid resuscitation and at discharge sodium was 124 -After discussion with family members and patient given his ongoing poor prognosis decision has been made to pursued hospice care, full comfort and symptomatic management only.  2-metastatic hepatocellular carcinoma -Patient found to be not a further candidate for chemo or immunotherapy at this time After discussion with palliative care for goals of care and advance directive decision made for pursuing symptomatic management and comfort care only -Patient is DNR/DNI 8 with intention not to have PEG tube or any artificial intervention. -Discharged on oxycodone and Xanax to assist with symptom management -Husband has been arranged to follow patient at home.  3-high cholesterol -At this moment statins has been discontinued -Plan is for comfort and the patient is actively experiencing transaminitis.  4-ascites Status post paracentesis x2 -Patient had 4 L removal on 1/22 on 5 L removed on 124 -He received 4 doses of albumin 30 days hospitalization -At this time plan is for full comfort and symptomatic management only.  5-acute kidney injury -In the setting of prerenal azotemia and with concern for potential hepatorenal syndrome -No further blood work anticipated -Creatinine last check 1.3 -Maintain adequate hydration and minimize the use of nephrotoxic agents. -Plan is for full comfort care.  6-peripheral arterial disease -Statin discontinued -Okay to continue Plavix for now.  7-acute encephalopathy -Multifactorial as well; in the setting of  electrolytes impairment and potential component of hepatic encephalopathy. -Following GI  recommendation will discharge with the use of rifaximin 500 mg twice a day. -Patient was oriented x2 and with better insight at time of discharge. Procedures:  See below for x-ray report  Paracentesis x2 1/22 and 1/24 (with a total of 9 L of ascitic fluid removed).  Consultations:  GI service  Palliative care  Oncology  Discharge Exam: Vitals:   10/02/18 0552 10/02/18 1349  BP: 106/69 109/84  Pulse: 73 84  Resp: 16 18  Temp:  98.2 F (36.8 C)  SpO2: 95% 95%    General: In no major distress, oriented x2, no vomiting.  Reports eating slightly better.  Still having abdominal pain and some intermittent nausea.  Positive jaundice. Cardiovascular: S1 and S2, no rubs, no gallops Respiratory: Good air movement bilaterally, no wheezing, no crackles. Abdomen: positive wave fluid from ascites), reports some tenderness on palpation, positive bowel sounds.  After 5 L of ascitic fluid removal on 10/01/2018 abdomen is slightly less distended. Extremities: No edema, no cyanosis, no clubbing.  Discharge Instructions   Discharge Instructions    Discharge instructions   Complete by:  As directed    Full comfort care Outpatient as needed paracentesis Follow hospice instructions for further symptomatic management.     Allergies as of 10/02/2018   No Known Allergies     Medication List    STOP taking these medications   atorvastatin 40 MG tablet Commonly known as:  LIPITOR   diphenoxylate-atropine 2.5-0.025 MG tablet Commonly known as:  LOMOTIL     TAKE these medications   ALPRAZolam 0.5 MG tablet Commonly known as:  XANAX Take 1 tablet (0.5 mg total) by mouth 3 (three) times daily as needed for anxiety (sleep).   clopidogrel 75 MG tablet Commonly known as:  PLAVIX Take 75 mg by mouth daily.   ondansetron 8 MG tablet Commonly known as:  ZOFRAN Take 1 tablet (8 mg total) by mouth  every 8 (eight) hours as needed for nausea or vomiting.   oxyCODONE 15 MG immediate release tablet Commonly known as:  ROXICODONE Take 1 tablet (15 mg total) by mouth every 4 (four) hours as needed for up to 7 days for pain (SOB). What changed:    when to take this  reasons to take this   prochlorperazine 10 MG tablet Commonly known as:  COMPAZINE Take 1 tablet (10 mg total) by mouth every 6 (six) hours as needed for nausea or vomiting.   rifaximin 550 MG Tabs tablet Commonly known as:  XIFAXAN Take 1 tablet (550 mg total) by mouth 2 (two) times daily.      No Known Allergies    The results of significant diagnostics from this hospitalization (including imaging, microbiology, ancillary and laboratory) are listed below for reference.    Significant Diagnostic Studies: Ct Chest W Contrast  Result Date: 09/16/2018 CLINICAL DATA:  Metastatic hepatocellular carcinoma with sternal metastasis status post radiation therapy and medical therapy. Restaging. EXAM: CT CHEST WITH CONTRAST TECHNIQUE: Multidetector CT imaging of the chest was performed during intravenous contrast administration. CONTRAST:  64mL OMNIPAQUE IOHEXOL 300 MG/ML  SOLN COMPARISON:  12/15/2017 PET-CT.  12/04/2017 chest CT. FINDINGS: Cardiovascular: Normal heart size. No significant pericardial effusion/thickening. Atherosclerotic nonaneurysmal thoracic aorta. Normal caliber pulmonary arteries. No central pulmonary emboli. Mediastinum/Nodes: No discrete thyroid nodules. Unremarkable esophagus. No pathologically enlarged axillary, mediastinal or hilar lymph nodes. Lungs/Pleura: No pneumothorax. Stable calcified bilateral pleural plaques. No pleural effusions. New posterior right upper lobe subpleural 9 mm nodular opacity (series 4/image 52). Peripheral left  upper lobe 6 mm subpleural nodule (series 4/image 82), stable since 12/04/2017 chest CT, considered benign. No acute consolidative airspace disease, lung masses or additional  significant pulmonary nodules. Upper abdomen: Diffusely irregular liver surface with atrophy of the right liver lobe and hypertrophy of the left liver lobe, compatible with advanced cirrhosis. Segment 4A left liver lobe 6.5 x 6.1 cm mass (series 2/image 128). Moderate volume ascites in the visualized upper abdomen. Left adrenal 2.9 cm mass (series 2/image 160), previously 3.2 cm on 12/04/2017 CT, mildly decreased. Right adrenal 1.3 cm mass (series 2/image 164), mildly decreased from 1.5 cm. Distended gallbladder without radiopaque cholelithiasis or definite gallbladder wall thickening. Musculoskeletal: No new focal osseous lesions. Lytic lesion in sternum measures 3.1 cm in width (series 4/image 92), unchanged in size from 12/04/2017 chest CT, with new associated pathologic fracture. Moderate thoracic spondylosis. IMPRESSION: 1. New pathologic fracture associated with the lytic sternal metastasis, which is stable in size from the most recent CT comparison study of 12/04/2017. 2. New subpleural 9 mm nodular opacity in the posterior right upper lobe, which is equivocal for focal atelectasis versus true pulmonary nodule. Suggest attention on follow-up chest CT in 3 months. 3. No additional potential sites of metastatic disease in the chest. 4. Bilateral adrenal metastases have mildly decreased in size compared to the most recent CT study of 12/04/2017. 5. Advanced hepatic cirrhosis with known segment 4A left liver lobe tumor. Moderate upper abdominal ascites. Please see the separate MRI abdomen report from today for details. 6. 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 No pleural effusions. Aortic Atherosclerosis (ICD10-I70.0). Electronically Signed   By: Ilona Sorrel M.D.   On: 09/16/2018 12:12   Mr Abdomen W Wo Contrast  Result Date: 09/16/2018 CLINICAL DATA:  Followup hepatocellular carcinoma. Patient had a difficult time with MR exam. EXAM: MRI ABDOMEN WITHOUT AND WITH  CONTRAST TECHNIQUE: Multiplanar multisequence MR imaging of the abdomen was performed both before and after the administration of intravenous contrast. Patient had difficult time with MRI scan.  No repeat CONTRAST:  7 mL Gadavist COMPARISON:  MRI 07/08/2018 FINDINGS: Lower chest:  Lung bases are clear. Hepatobiliary: Dominant mass in the anterior aspect of the central liver measures 8.6 x 7.1 cm (image 31/9) is similar to 8.8 x 7.1 cm comparison exam. On postcontrast imaging, the central portion the exam lesion measuring 6.4 x 6.3 cm compared to 6.6 x 6.1 cm for minimal change. No new hepatic lesions are identified. Liver has a nodular contour with shrunken RIGHT hepatic lobe and enlarged caudate lobe. There is a moderate volume of ascites along the RIGHT hepatic margin which is similar volume to comparison exam No biliary duct dilatation. Gallbladder is distended elongated measuring 5 cm in diameter and 12 cm in length. There is dependent sludge layering in the gallbladder. This gallbladder distension is mildly increased from prior common bile duct is normal caliber Pancreas: Normal pancreatic parenchymal intensity. No ductal dilatation or inflammation. Spleen: Normal spleen. Adrenals/urinary tract: Presumed LEFT adrenal gland metastasis is unchanged measuring 2.7 x 2.5 cm. RIGHT adrenal gland appears normal kidneys normal. Stomach/Bowel: Stomach and limited of the small bowel is unremarkable Vascular/Lymphatic: Abdominal aortic normal caliber. No retroperitoneal periportal lymphadenopathy. Musculoskeletal: No aggressive osseous lesion IMPRESSION: 1. Little interval change in the hepatic mass or ascites from MRI 07/08/2018. Patient experienced difficulty with the lengthy MRI exam. 2. Dominant mass in the superior aspect of the central liver anteriorly not significantly changed. 3. Large volume ascites predominantly along the  RIGHT hepatic margin is not changed. 4. Morphologic changes consistent cirrhosis again  demonstrated. 5. Distension and elongation of the gallbladder with sludge has increased mildly from comparison exam. RECOMMEND CORRELATION FOR ACUTE OR CHRONIC CHOLECYSTITIS. These results will be called to the ordering clinician or representative by the Radiologist Assistant, and communication documented in the PACS or zVision Dashboard. Electronically Signed   By: Suzy Bouchard M.D.   On: 09/16/2018 10:52   Ct Abdomen Pelvis W Contrast  Result Date: 09/29/2018 CLINICAL DATA:  Known hepatic neoplasm EXAM: CT ABDOMEN AND PELVIS WITH CONTRAST TECHNIQUE: Multidetector CT imaging of the abdomen and pelvis was performed using the standard protocol following bolus administration of intravenous contrast. CONTRAST:  38mL OMNIPAQUE IOHEXOL 300 MG/ML  SOLN COMPARISON:  MRI from 09/16/2018 FINDINGS: Lower chest: Lung bases demonstrate no focal infiltrate or sizable effusion. Sternal fracture is again identified similar to that seen on prior CT of the chest. Hepatobiliary: There is again noted a large centrally necrotic mass lesion measuring approximately 6.6 x 7.0 cm. The overall appearance is similar to that seen on recent MRI examination. No new focal hepatic lesion is seen. Underlying changes of cirrhosis are seen. The gallbladder is within normal limits. Pancreas: Unremarkable. No pancreatic ductal dilatation or surrounding inflammatory changes. Spleen: Normal in size without focal abnormality. Adrenals/Urinary Tract: \Left adrenal lesion is seen and stable in appearance from the prior MRI. Right adrenal gland demonstrates a small 13 mm hypodensity also likely representing metastatic disease similar to that seen on prior CT examination. Kidneys demonstrate no mass lesion. Normal excretion of contrast is seen. The bladder is decompressed by Foley catheter. Stomach/Bowel: The appendix is within normal limits. The large and small bowel demonstrate no obstructive changes. The stomach is decompressed. Mild esophageal  varices are noted distally. Vascular/Lymphatic: Aortic atherosclerosis. No enlarged abdominal or pelvic lymph nodes. Reproductive: Prostate is unremarkable. Other: Considerable ascites is noted. This is increased when compared with the prior CT and MRI. Some mild peritoneal enhancement is noted which may represent some implantation although no dominant lesions are seen. Musculoskeletal: Pathologic fracture is noted in the lower sternum consistent with the known metastatic lesion. Postsurgical changes are noted within the lower lumbar spine. No other definitive metastatic disease is noted. IMPRESSION: Stable appearing hepatic mass. Some peritoneal enhancement is noted without definitive mass lesions. This may represent some early carcinomatosis. Significant increase in the degree of ascites when compared with previous exams. Sternal fracture consistent with known metastatic disease. Bilateral adrenal metastatic disease. Electronically Signed   By: Inez Catalina M.D.   On: 09/29/2018 11:30   US Paracentesis  Result Date: 10/01/2018 INDICATION: Metastatic hepatocellular carcinoma, cirrhosis, ascites EXAM: ULTRASOUND GUIDED THERAPEUTIC PARACENTESIS MEDICATIONS: None. COMPLICATIONS: None immediate. PROCEDURE: Procedure, benefits, and risks of procedure were discussed with patient. Written informed consent for procedure was obtained. Time out protocol followed. Adequate collection of ascites localized by ultrasound in RIGHT lower quadrant. Skin prepped and draped in usual sterile fashion. Skin and soft tissues anesthetized with 10 mL of 1% lidocaine. 5 [place] Yueh catheter placed into peritoneal cavity. 5 L of yellow ascitic fluid aspirated by vacuum bottle suction. Procedure tolerated well by patient without immediate complication. FINDINGS: As above IMPRESSION: Successful ultrasound-guided paracentesis yielding 5 liters of peritoneal fluid. Electronically Signed   By: Lavonia Dana M.D.   On: 10/01/2018 15:22   US  Paracentesis  Result Date: 09/29/2018 INDICATION: Hepatic mass, ascites, cirrhotic liver with suspected hepatocellular carcinoma EXAM: ULTRASOUND GUIDED DIAGNOSTIC AND THERAPEUTIC PARACENTESIS MEDICATIONS: None.  COMPLICATIONS: None immediate. PROCEDURE: Informed written consent was obtained from the patient after a discussion of the risks, benefits and alternatives to treatment. A timeout was performed prior to the initiation of the procedure. Initial ultrasound scanning demonstrates a large amount of ascites within the right lower abdominal quadrant. The right lower abdomen was prepped and draped in the usual sterile fashion. 1% lidocaine with epinephrine was used for local anesthesia. Following this, a 8 French catheter was introduced. An ultrasound image was saved for documentation purposes. The paracentesis was performed. The catheter was removed and a dressing was applied. The patient tolerated the procedure well without immediate post procedural complication. FINDINGS: A total of approximately 4.18 L of cloudy yellow colored ascitic fluid was removed. Samples were sent to the laboratory as requested by the clinical team. IMPRESSION: Successful ultrasound-guided paracentesis yielding 4.18 liters of peritoneal fluid. Electronically Signed   By: Lavonia Dana M.D.   On: 09/29/2018 16:59    Microbiology: Recent Results (from the past 240 hour(s))  Gram stain     Status: None   Collection Time: 09/29/18  4:30 PM  Result Value Ref Range Status   Specimen Description ASCITIC  Final   Special Requests NONE  Final   Gram Stain   Final    NO ORGANISMS SEEN Performed at [facility] CYTOSPIN SMEAR WBC PRESENT, PREDOMINANTLY MONONUCLEAR Performed at [facility], [address], [place], [place] [address]    Report Status 09/29/2018 FINAL  Final  Culture, body fluid-bottle     Status: None (Preliminary result)   Collection Time: 09/29/18  4:30 PM  Result Value Ref Range Status   Specimen  Description ASCITIC  Final   Special Requests BOTTLES DRAWN AEROBIC AND ANAEROBIC 10CC EACH  Final   Culture   Final    NO GROWTH 2 DAYS Performed at [facility], [address], [place], [place] [address]    Report Status PENDING  Incomplete     Labs: Basic Metabolic Panel: Recent Labs  Lab 09/29/18 0843 09/29/18 1954 09/29/18 2336 09/30/18 0413 09/30/18 0907 10/02/18 0700  NA 122* 118* 119* 123* 121* 124*  K 4.5 4.6  --  4.8  --  4.7  CL 87* 88*  --  90*  --  92*  CO2 28 22  --  27  --  24  GLUCOSE 156* 109*  --  118*  --  88  BUN 42* 37*  --  35*  --  35*  CREATININE 1.70* 1.44*  --  1.59*  --  1.35*  CALCIUM 8.4* 7.9*  --  7.8*  --  8.5*   Liver Function Tests: Recent Labs  Lab 09/29/18 0843 10/02/18 0700  AST 121* 71*  ALT 57* 34  ALKPHOS 84 51  BILITOT 3.8* 5.3*  PROT 7.3 6.0*  ALBUMIN 2.4* 3.3*   Recent Labs  Lab 09/29/18 0843  LIPASE 42   Recent Labs  Lab 09/29/18 0843 10/02/18 0625  AMMONIA 16 22   CBC: Recent Labs  Lab 09/29/18 0843 09/30/18 0413  WBC 4.9 4.8  NEUTROABS 3.3  --   HGB 16.3 15.1  HCT 47.1 43.5  MCV 101.1* 102.4*  PLT 90* 69*   Cardiac Enzymes: Recent Labs  Lab 09/29/18 0843  TROPONINI <0.03    Signed:  Barton Dubois MD.  [facility] 10/02/2018, 5:02 PM

## 2018-10-04 LAB — CULTURE, BODY FLUID W GRAM STAIN -BOTTLE: Culture: NO GROWTH

## 2018-10-04 LAB — CULTURE, BODY FLUID-BOTTLE

## 2018-10-08 ENCOUNTER — Other Ambulatory Visit (HOSPITAL_COMMUNITY): Payer: BLUE CROSS/BLUE SHIELD

## 2018-10-08 ENCOUNTER — Ambulatory Visit (HOSPITAL_COMMUNITY): Payer: BLUE CROSS/BLUE SHIELD | Admitting: Hematology

## 2018-10-08 ENCOUNTER — Ambulatory Visit (HOSPITAL_COMMUNITY): Payer: BLUE CROSS/BLUE SHIELD

## 2018-10-08 NOTE — Progress Notes (Deleted)
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      INTERVAL HISTORY:  [name] 63 y.o. 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    REVIEW OF SYSTEMS:  Review of Systems - Oncology   PAST MEDICAL/SURGICAL HISTORY:  Past Medical History:  Diagnosis Date  . Cancer ([place])   . High cholesterol   . PAD (peripheral artery disease) ([place])    Past Surgical History:  Procedure Laterality Date  . BACK SURGERY    . IR RADIOLOGIST EVAL & MGMT  01/12/2018     SOCIAL HISTORY:  Social History   Socioeconomic History  . Marital status: Married    Spouse name: Not on file  . Number of children: Not on file  . Years of education: Not on file  . Highest education level: Not on file  Occupational History  . Not on file  Social Needs  . Financial resource strain: Not on file  . Food insecurity:    Worry: Not on file    Inability: Not on file  . Transportation needs:    Medical: Not on file    Non-medical: Not on file  Tobacco Use  . Smoking status: Former Smoker    Packs/day: 2.00    Years: 40.00    Pack years: 80.00    Types: Cigarettes    Last attempt to quit: 12/09/2012    Years since quitting: 5.8  . Smokeless tobacco: Never Used  Substance and Sexual Activity  . Alcohol use: Not Currently    Alcohol/week: 15.0 standard drinks    Types: 15  Cans of beer per week    Comment: 6 3times a week  . Drug use: Never  . Sexual activity: Not on file  Lifestyle  . Physical activity:    Days per week: Not on file    Minutes per session: Not on file  . Stress: Not on file  Relationships  . Social connections:    Talks on phone: Not on file    Gets together: Not on file    Attends religious service: Not on file    Active member of club or organization: Not on file    Attends meetings of clubs or organizations: Not on file    Relationship status: Not on file  . Intimate partner violence:    Fear of current or ex partner: Not on file    Emotionally abused: Not on file    Physically abused: Not on file    Forced sexual activity: Not on file  Other Topics Concern  . Not on file  Social History Narrative  . Not on file    FAMILY HISTORY:  Family History  Problem Relation Age of Onset  . Alzheimer's disease Mother   . Cancer Sister        breast    CURRENT MEDICATIONS:  Outpatient Encounter  Medications as of 10/08/2018  Medication Sig  . ALPRAZolam (XANAX) 0.5 MG tablet Take 1 tablet (0.5 mg total) by mouth 3 (three) times daily as needed for anxiety (sleep).  . clopidogrel (PLAVIX) 75 MG tablet Take 75 mg by mouth daily.  . ondansetron (ZOFRAN) 8 MG tablet Take 1 tablet (8 mg total) by mouth every 8 (eight) hours as needed for nausea or vomiting.  Marland Kitchen oxyCODONE (ROXICODONE) 15 MG immediate release tablet Take 1 tablet (15 mg total) by mouth every 4 (four) hours as needed for up to 7 days for pain (SOB).  Marland Kitchen prochlorperazine (COMPAZINE) 10 MG tablet Take 1 tablet (10 mg total) by mouth every 6 (six) hours as needed for nausea or vomiting.  . rifaximin (XIFAXAN) 550 MG TABS tablet Take 1 tablet (550 mg total) by mouth 2 (two) times daily.   No facility-administered encounter medications on file as of 10/08/2018.     ALLERGIES:  No Known Allergies   PHYSICAL EXAM:  ECOG Performance status: 1  There were no vitals filed for  this visit. There were no vitals filed for this visit.  Physical Exam   LABORATORY DATA:  I have reviewed the labs as listed.  CBC    Component Value Date/Time   WBC 4.8 09/30/2018 0413   RBC 4.25 09/30/2018 0413   HGB 15.1 09/30/2018 0413   HCT 43.5 09/30/2018 0413   PLT 69 (L) 09/30/2018 0413   MCV 102.4 (H) 09/30/2018 0413   MCH 35.5 (H) 09/30/2018 0413   MCHC 34.7 09/30/2018 0413   RDW 15.0 09/30/2018 0413   LYMPHSABS 0.9 09/29/2018 0843   MONOABS 0.7 09/29/2018 0843   EOSABS 0.0 09/29/2018 0843   BASOSABS 0.0 09/29/2018 0843   CMP Latest Ref Rng & Units 10/02/2018 09/30/2018 09/30/2018  Glucose 70 - 99 mg/dL 88 - 118(H)  BUN 8 - 23 mg/dL 35(H) - 35(H)  Creatinine 0.61 - 1.24 mg/dL 1.35(H) - 1.59(H)  Sodium 135 - 145 mmol/L 124(L) 121(L) 123(L)  Potassium 3.5 - 5.1 mmol/L 4.7 - 4.8  Chloride 98 - 111 mmol/L 92(L) - 90(L)  CO2 22 - 32 mmol/L 24 - 27  Calcium 8.9 - 10.3 mg/dL 8.5(L) - 7.8(L)  Total Protein 6.5 - 8.1 g/dL 6.0(L) - -  Total Bilirubin 0.3 - 1.2 mg/dL 5.3(H) - -  Alkaline Phos 38 - 126 U/L 51 - -  AST 15 - 41 U/L 71(H) - -  ALT 0 - 44 U/L 34 - -       DIAGNOSTIC IMAGING:  I have independently reviewed the scans and discussed with the patient.   I have reviewed Charles Finders, NP's note and agree with the documentation.  I personally performed a face-to-face visit, made revisions and my assessment and plan is as follows.    ASSESSMENT & PLAN:   No problem-specific Assessment & Plan notes found for this encounter.      Orders placed this encounter:  No orders of the defined types were placed in this encounter.     Derek Jack, MD [facility] [phone]

## 2018-11-03 IMAGING — MR MR ABDOMEN WO/W CM
10 of 18 series · 24 of 48 positions shown · IV contrast (15ml Multihance)
Comparison: 01/13/2018

CLINICAL DATA: Abdominal swelling.  History of liver cancer.

EXAM:
MRI ABDOMEN WITHOUT AND WITH CONTRAST
TECHNIQUE: Multiplanar multisequence MR imaging of the abdomen was performed
both before and after the administration of intravenous contrast.
CONTRAST:  15mL MULTIHANCE GADOBENATE DIMEGLUMINE 529 MG/ML IV SOLN

[Series 4: T2 · coronal · 5.0mm · 1.17mm/px · 1 of 36 slices shown (1 of 2)]
[im 1/36]
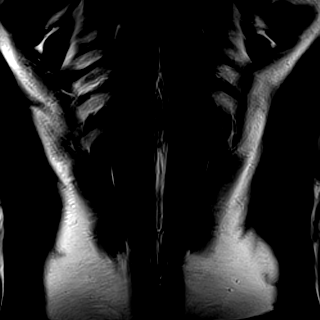

[Series 5: t2fs axial bh · axial · 5.0mm · 1.39mm/px · 1 of 40 slices shown]
[im 1/40]
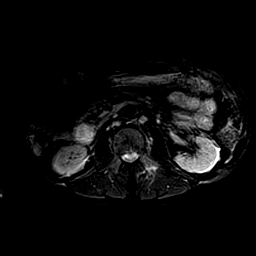

[Series 6: bSSFP · axial · 4.0mm · 0.68mm/px · z∈[-116,+160]mm · 2 of 70 slices shown]
[im 1/70]
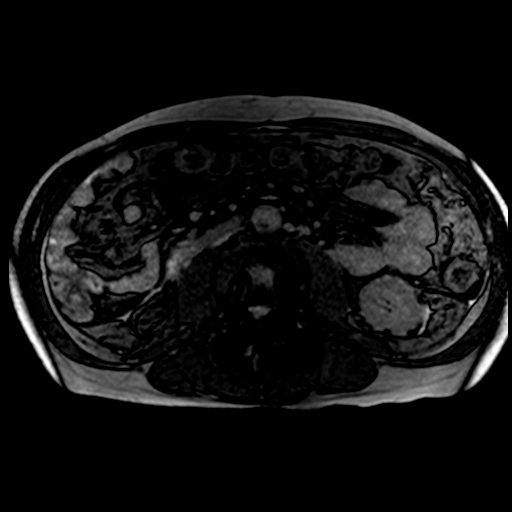
[im 70/70]
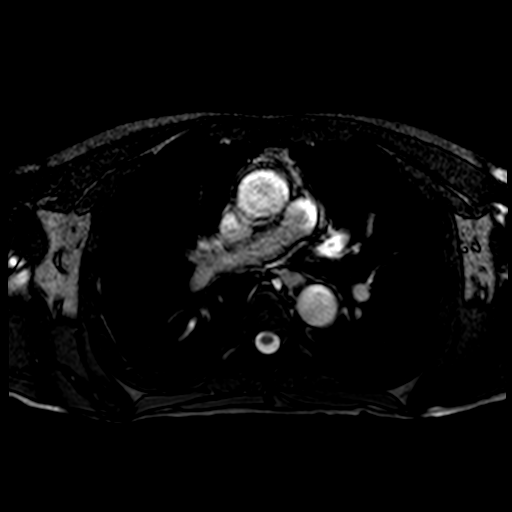

[Series 7: T1 · axial · 4.0mm · 0.59mm/px · z∈[-147,+105]mm · 3 of 64 slices shown (1 of 2)]
[im 1/64]
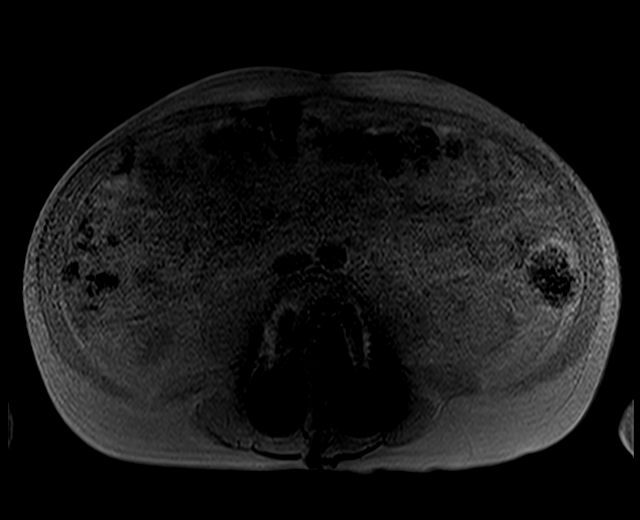
[im 32/64]
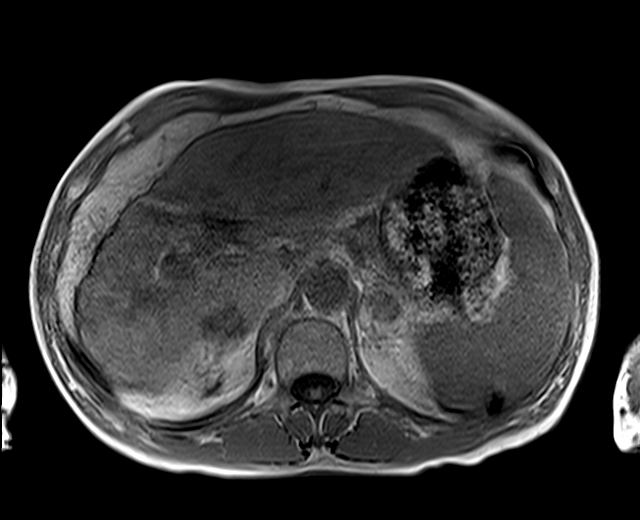
[im 64/64]
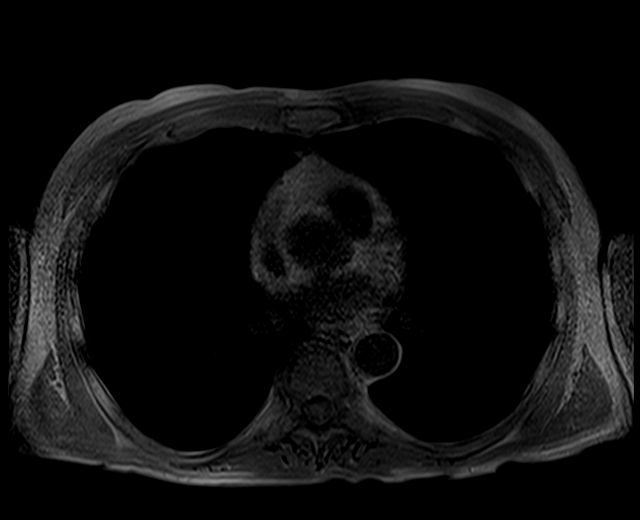

[Series 8: T1 · axial · 4.0mm · 0.59mm/px · z∈[-147,+105]mm · 3 of 64 slices shown (2 of 2)]
[im 1/64]
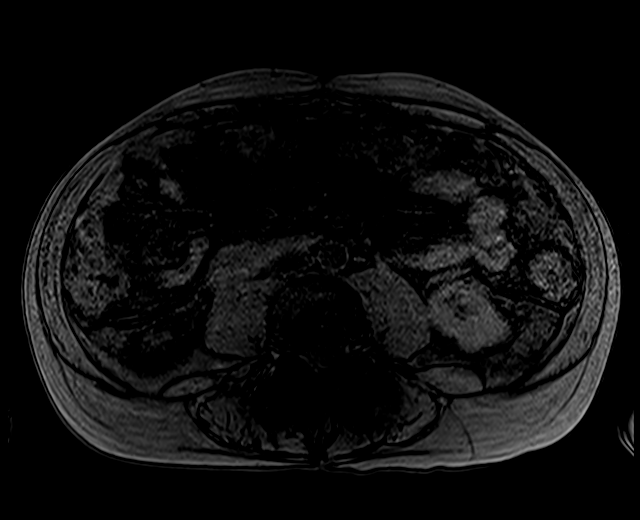
[im 32/64]
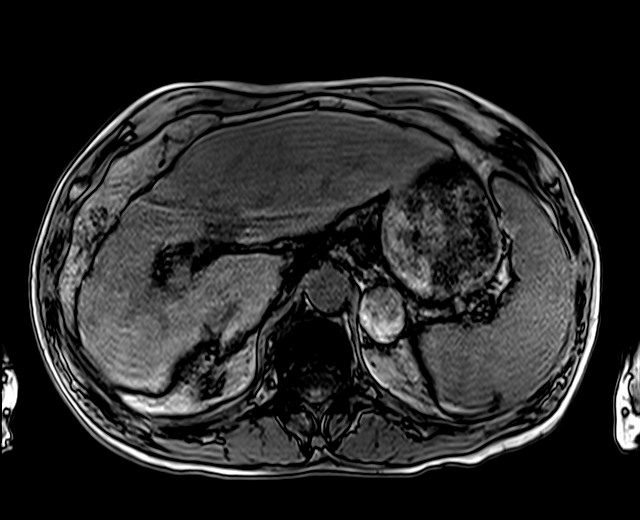
[im 64/64]
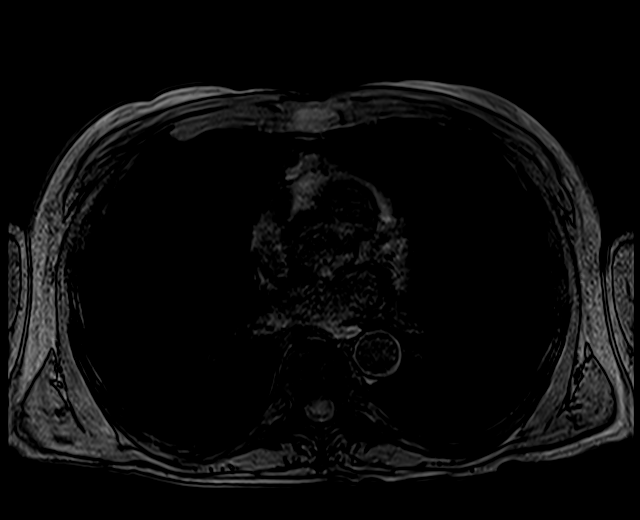

[Series 11: DWI · axial · 5.0mm · 0.99mm/px · z∈[-95,+139]mm · 3 of 80 slices shown (1 of 2)]
[im 1/80]
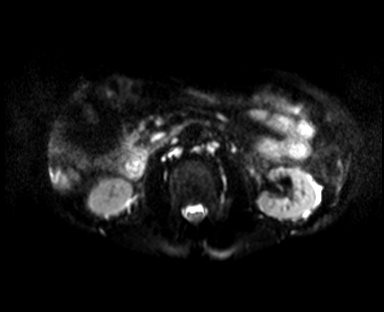
[im 40/80]
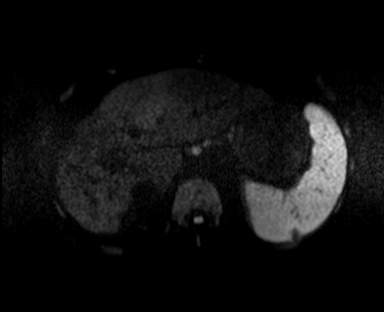
[im 80/80]
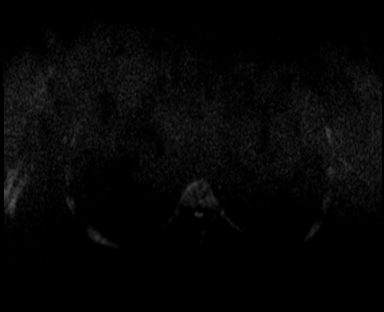

[Series 12: DWI · axial · 5.0mm · 0.99mm/px · z∈[-95,+139]mm · 2 of 40 slices shown (2 of 2)]
[im 1/40]
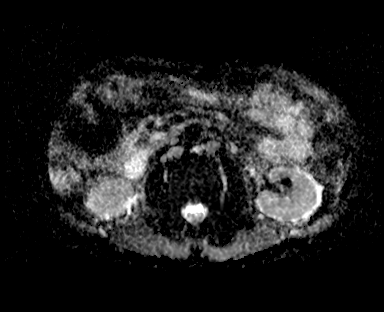
[im 40/40]
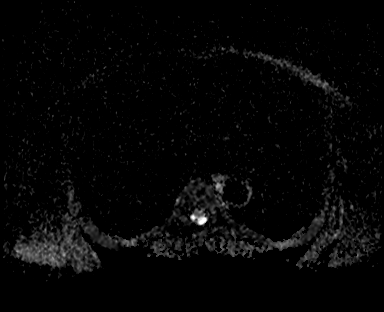

[Series 13: pre test · axial · non-contrast · 3.5mm · 0.59mm/px · z∈[-145,+103]mm · 3 of 72 slices shown]
[im 1/72]
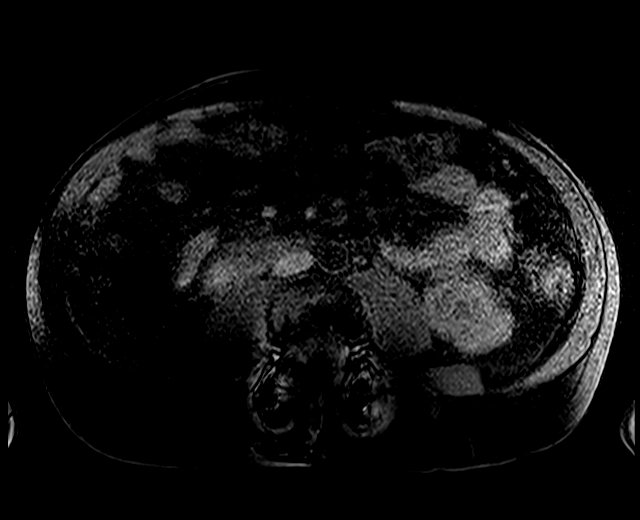
[im 36/72]
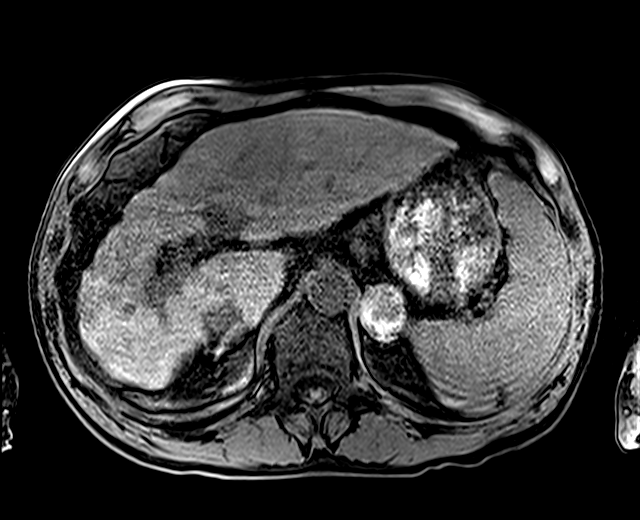
[im 72/72]
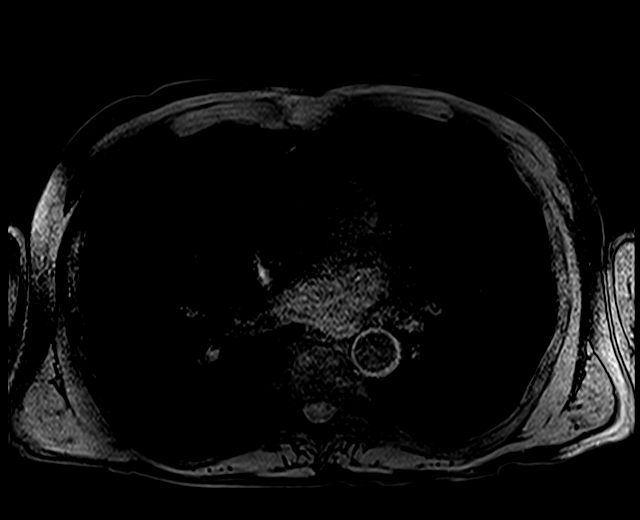

[Series 21: t1fs coronal post · coronal · 2.5mm · 1.27mm/px · 4 of 96 slices shown]
[im 1/96]
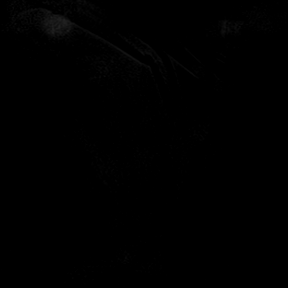
[im 32/96]
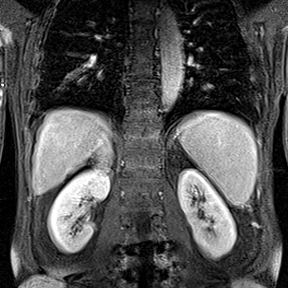
[im 64/96]
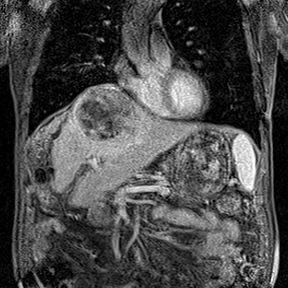
[im 96/96]
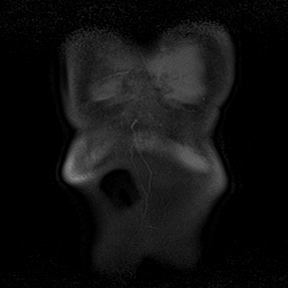

[Series 22: T2 · axial · 5.0mm · 1.38mm/px · z∈[-95,+139]mm · 2 of 40 slices shown (2 of 2)]
[im 1/40]
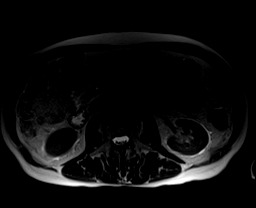
[im 40/40]
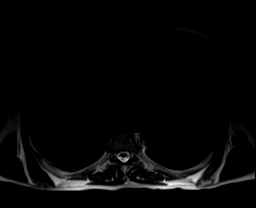

[24 of 48 positions shown; findings below may reference images not displayed]

FINDINGS: Lower chest: No acute findings.

Hepatobiliary: Liver is cirrhotic with marked hypertrophy of the
lateral segment of left lobe and caudate lobe of liver. The contour
the liver is irregular. Large mass involving segment 4 a is again
identified. This measures 9.7 by 10.6 cm, image 25/4112. Previously
10.8 x 12.2 cm. Nonocclusive tumor extension into the supra hepatic
IVC is again noted, image [DATE]. The previously characterized LI
4 (probably HCC) within the posterior aspect of segment 7 is again
noted measuring 1.5 by 1.6 cm, image 33/4112. Stable in size from
previous exam. This exhibits arterial enhancement with no
appreciable washout on today's exam which is somewhat limited by
motion artifact. Previously characterized LI 3 (intermediate
probability for HCC) lesion within caudate lobe measures 1.0 x
cm, image 25/4112. Previously 1.4 x 0.9 cm. No new liver lesions
identified. Gallbladder unremarkable. No gallstones. Mild
gallbladder wall thickening. No biliary ductal dilatation
identified.

Pancreas: No mass, inflammatory changes, or other parenchymal
abnormality identified.

Spleen:  Within normal limits in size and appearance.

Adrenals/Urinary Tract: The left adrenal gland lesion measures
the right adrenal gland nodule measures 1.3 cm, image 37/4112.
Stable from previous exam. Cm, image 35/2. Previously 3.4 cm.

Stomach/Bowel: Visualized portions within the abdomen are
unremarkable.

Vascular/Lymphatic: Atherosclerotic disease. No aneurysm.
Paraumbilical and gastric varices. Small upper abdominal lymph
nodes. No adenopathy.

Other:  Trace perihepatic ascites as before.

Musculoskeletal: Enhancing sternal lesion is again noted, image
91/21. This measures 3.8 by 3.1 cm, image 90/21. Previously 2.1 by
3.1 cm.
IMPRESSION: 1. The dominant hepatocellular carcinoma within segment 4 a is again
noted and is slightly decreased in size from previous exam.
Continued extension into the supra patent IVC noted.
2. There has also been decrease in size of caudate lobe lesion. The
posterior segment 7 lesion is stable. No new liver lesions again
noted.
3. Similar size of bilateral adrenal gland metastasis.
4. Increase in size of enhancing sternal metastasis.
5. Cirrhosis with stigmata of portal venous hypertension
6.  Aortic Atherosclerosis ([id number]).

## 2018-11-07 DEATH — deceased
